# Patient Record
Sex: Female | Born: 1980 | Race: Black or African American | Hispanic: No | Marital: Single | Smoking: Never smoker
Health system: Southern US, Community
[De-identification: ages and names within clinical notes are randomized; demographics above are authoritative.]

## PROBLEM LIST (undated history)

## (undated) DIAGNOSIS — I4891 Unspecified atrial fibrillation: Secondary | ICD-10-CM

## (undated) DIAGNOSIS — Z91410 Personal history of adult physical and sexual abuse: Secondary | ICD-10-CM

## (undated) DIAGNOSIS — I10 Essential (primary) hypertension: Secondary | ICD-10-CM

## (undated) DIAGNOSIS — G43909 Migraine, unspecified, not intractable, without status migrainosus: Secondary | ICD-10-CM

## (undated) HISTORY — PX: DILATION AND CURETTAGE OF UTERUS: SHX78

## (undated) HISTORY — PX: LAPAROSCOPY: SHX197

## (undated) HISTORY — DX: Essential (primary) hypertension: I10

## (undated) HISTORY — PX: FOOT SURGERY: SHX648

## (undated) HISTORY — DX: Personal history of adult physical and sexual abuse: Z91.410

## (undated) HISTORY — DX: Migraine, unspecified, not intractable, without status migrainosus: G43.909

## (undated) HISTORY — DX: Unspecified atrial fibrillation: I48.91

## (undated) SURGERY — DILATION AND CURETTAGE
Anesthesia: General

---
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
## 2004-02-22 ENCOUNTER — Emergency Department: Payer: Self-pay | Admitting: Emergency Medicine

## 2004-05-11 ENCOUNTER — Emergency Department: Payer: Self-pay | Admitting: Emergency Medicine

## 2004-05-23 HISTORY — PX: DIAGNOSTIC LAPAROSCOPY: SUR761

## 2004-06-05 ENCOUNTER — Emergency Department: Payer: Self-pay | Admitting: Emergency Medicine

## 2004-07-16 ENCOUNTER — Emergency Department: Payer: Self-pay | Admitting: Emergency Medicine

## 2004-07-17 ENCOUNTER — Emergency Department: Payer: Self-pay | Admitting: Emergency Medicine

## 2004-08-22 ENCOUNTER — Emergency Department: Payer: Self-pay | Admitting: Internal Medicine

## 2005-01-23 ENCOUNTER — Emergency Department: Payer: Self-pay | Admitting: Internal Medicine

## 2005-02-10 ENCOUNTER — Ambulatory Visit: Payer: Self-pay | Admitting: Unknown Physician Specialty

## 2005-04-25 ENCOUNTER — Emergency Department: Payer: Self-pay | Admitting: Emergency Medicine

## 2005-11-10 ENCOUNTER — Emergency Department: Payer: Self-pay | Admitting: General Practice

## 2005-11-25 ENCOUNTER — Emergency Department: Payer: Self-pay | Admitting: Emergency Medicine

## 2006-02-18 ENCOUNTER — Emergency Department: Payer: Self-pay | Admitting: Unknown Physician Specialty

## 2006-06-05 ENCOUNTER — Other Ambulatory Visit: Payer: Self-pay

## 2006-06-05 ENCOUNTER — Emergency Department: Payer: Self-pay | Admitting: Emergency Medicine

## 2007-02-03 ENCOUNTER — Emergency Department: Payer: Self-pay | Admitting: Unknown Physician Specialty

## 2007-05-24 HISTORY — PX: CARDIAC CATHETERIZATION: SHX172

## 2007-06-28 ENCOUNTER — Encounter: Payer: Self-pay | Admitting: Maternal & Fetal Medicine

## 2007-07-14 ENCOUNTER — Emergency Department: Payer: Self-pay | Admitting: Emergency Medicine

## 2007-08-02 ENCOUNTER — Encounter: Payer: Self-pay | Admitting: Maternal & Fetal Medicine

## 2007-09-20 ENCOUNTER — Observation Stay: Payer: Self-pay | Admitting: Unknown Physician Specialty

## 2007-09-27 ENCOUNTER — Encounter: Payer: Self-pay | Admitting: Maternal & Fetal Medicine

## 2007-11-11 ENCOUNTER — Observation Stay: Payer: Self-pay | Admitting: Obstetrics and Gynecology

## 2007-11-23 ENCOUNTER — Observation Stay: Payer: Self-pay

## 2007-12-03 ENCOUNTER — Observation Stay: Payer: Self-pay | Admitting: Obstetrics and Gynecology

## 2007-12-06 ENCOUNTER — Observation Stay: Payer: Self-pay | Admitting: Unknown Physician Specialty

## 2007-12-07 ENCOUNTER — Observation Stay: Payer: Self-pay

## 2007-12-10 ENCOUNTER — Inpatient Hospital Stay: Payer: Self-pay

## 2008-06-17 ENCOUNTER — Ambulatory Visit: Payer: Self-pay | Admitting: General Surgery

## 2008-10-01 ENCOUNTER — Inpatient Hospital Stay: Payer: Self-pay | Admitting: Internal Medicine

## 2009-05-29 ENCOUNTER — Emergency Department: Payer: Self-pay | Admitting: Emergency Medicine

## 2009-12-24 ENCOUNTER — Emergency Department: Payer: Self-pay | Admitting: Emergency Medicine

## 2009-12-28 ENCOUNTER — Ambulatory Visit: Payer: Self-pay | Admitting: General Practice

## 2010-10-25 ENCOUNTER — Ambulatory Visit: Payer: Self-pay | Admitting: General Practice

## 2011-05-28 IMAGING — CR DG ANKLE COMPLETE 3+V*L*
1 series · 5 of 5 positions shown · non-contrast
Comparison: none

REASON FOR EXAM: pain fax result to [phone]
COMMENTS:

PROCEDURE:     DXR - DXR ANKLE LEFT COMPLETE  - December 28, 2009  [DATE]
RESULT:     Soft tissue swelling is identified within the medial and lateral
malleolar regions. There is no evidence of acute fracture or dislocation.

[Series 1: view not recorded · 0.17mm/px · 5 of 5 slices shown]
[im 1/5]
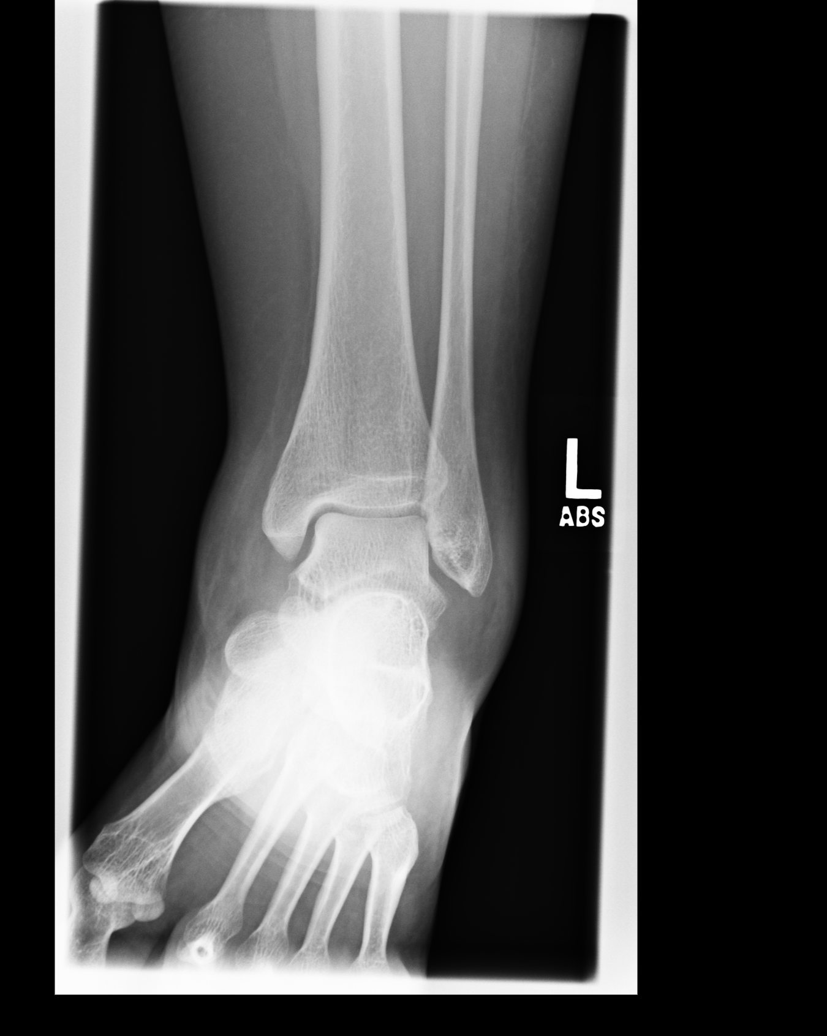
[im 2/5]
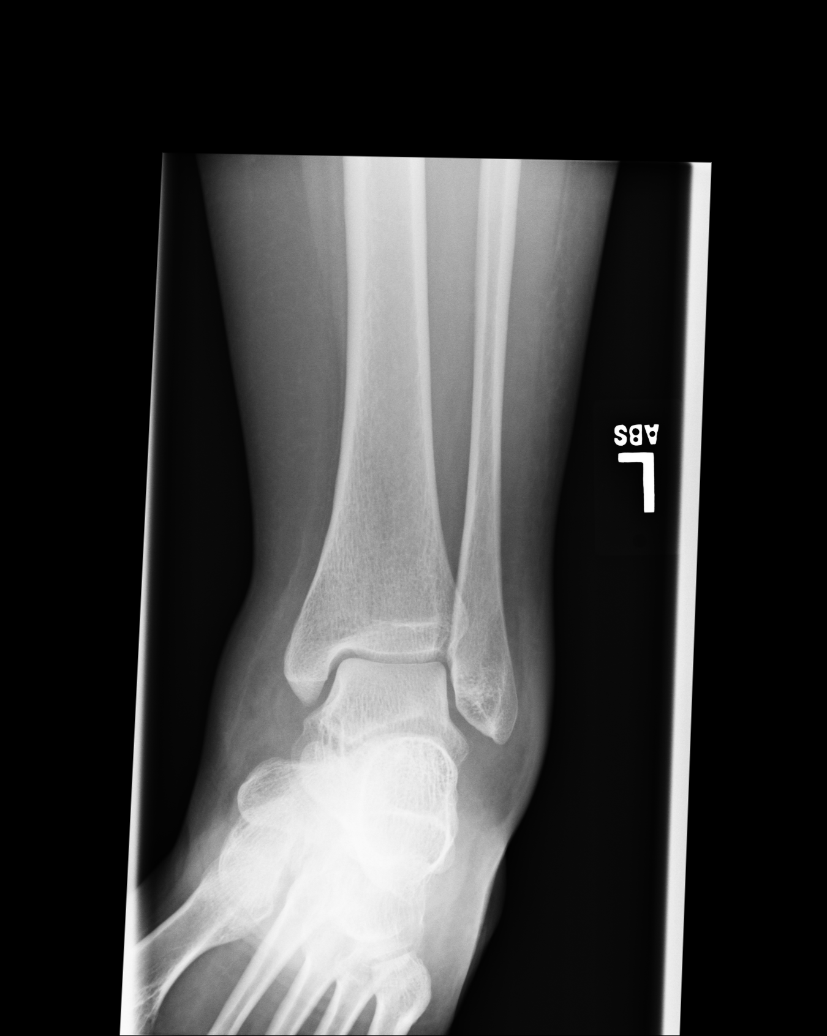
[im 3/5]
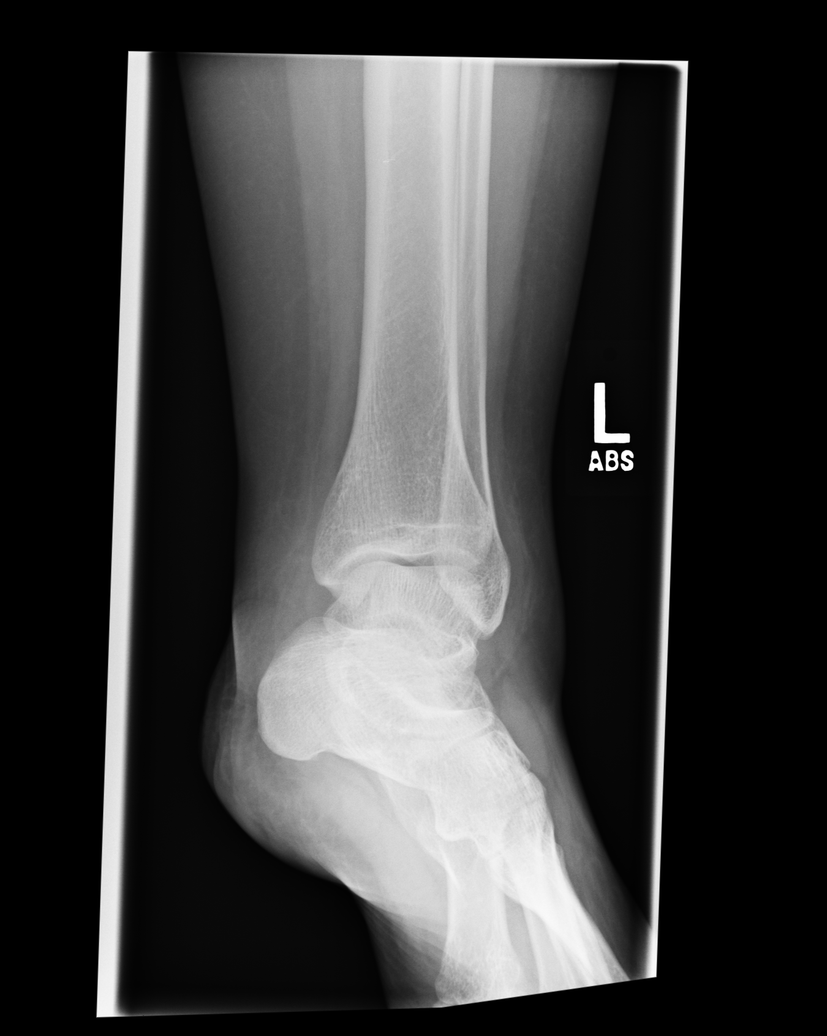
[im 4/5]
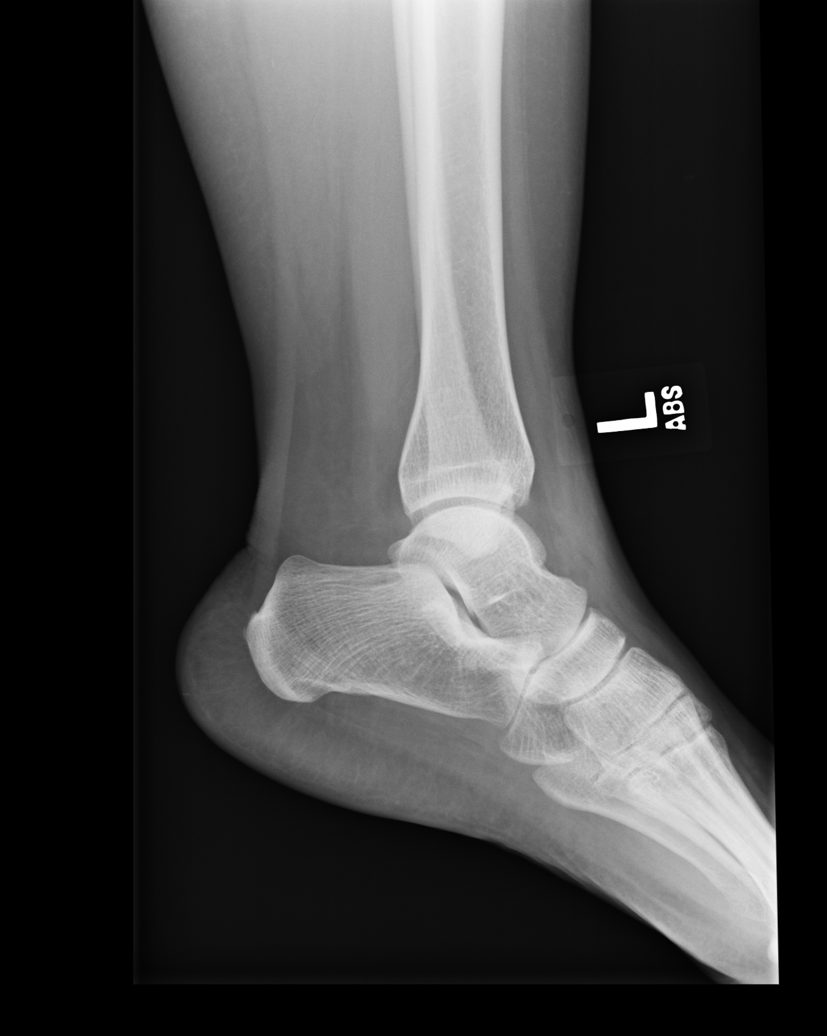
[im 5/5]
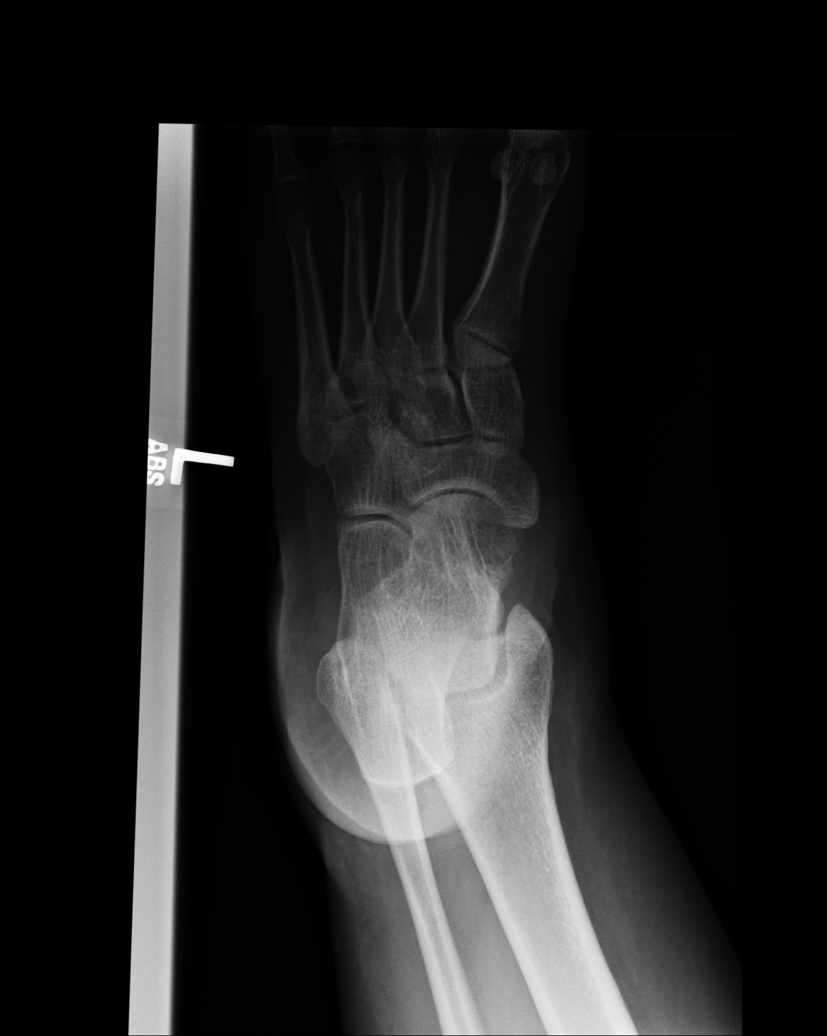

[5 of 5 positions shown; findings below may reference images not displayed]

IMPRESSION: 1. No evidence of acute osseous abnormalities.

2. Findings which may represent a component of ligamentous injury.
Surveillance evaluation is recommended if and as clinically warranted.

## 2011-10-28 ENCOUNTER — Ambulatory Visit: Payer: Self-pay

## 2011-10-28 LAB — HEMATOCRIT: HCT: 40.2 % (ref 35.0–47.0)

## 2011-12-07 ENCOUNTER — Ambulatory Visit: Payer: Self-pay | Admitting: General Practice

## 2012-02-16 ENCOUNTER — Emergency Department: Payer: Self-pay | Admitting: Emergency Medicine

## 2012-02-16 LAB — URINALYSIS, COMPLETE
Bilirubin,UR: NEGATIVE
Blood: NEGATIVE
Ketone: NEGATIVE
Ph: 5 (ref 4.5–8.0)
Protein: NEGATIVE
RBC,UR: 3 /HPF (ref 0–5)
Specific Gravity: 1.033 (ref 1.003–1.030)
Squamous Epithelial: 35

## 2012-02-16 LAB — LIPASE, BLOOD: Lipase: 181 U/L (ref 73–393)

## 2012-02-16 LAB — COMPREHENSIVE METABOLIC PANEL
Alkaline Phosphatase: 86 U/L (ref 50–136)
Anion Gap: 6 — ABNORMAL LOW (ref 7–16)
Bilirubin,Total: 0.6 mg/dL (ref 0.2–1.0)
Calcium, Total: 8.8 mg/dL (ref 8.5–10.1)
Chloride: 108 mmol/L — ABNORMAL HIGH (ref 98–107)
Co2: 26 mmol/L (ref 21–32)
Creatinine: 0.77 mg/dL (ref 0.60–1.30)
EGFR (African American): >60
EGFR (Non-African Amer.): >60
Osmolality: 276 (ref 275–301)
SGPT (ALT): 19 U/L (ref 12–78)
Sodium: 140 mmol/L (ref 136–145)
Total Protein: 7.2 g/dL (ref 6.4–8.2)

## 2012-02-16 LAB — CBC
MCH: 31.1 pg (ref 26.0–34.0)
MCHC: 35 g/dL (ref 32.0–36.0)
MCV: 89 fL (ref 80–100)
Platelet: 269 10*3/uL (ref 150–440)
RDW: 13.1 % (ref 11.5–14.5)
WBC: 4 10*3/uL (ref 3.6–11.0)

## 2012-02-16 LAB — PREGNANCY, URINE: Pregnancy Test, Urine: NEGATIVE m[IU]/mL

## 2012-07-25 ENCOUNTER — Emergency Department: Payer: Self-pay | Admitting: Emergency Medicine

## 2013-03-27 ENCOUNTER — Emergency Department: Payer: Self-pay | Admitting: Emergency Medicine

## 2013-03-27 LAB — CBC WITH DIFFERENTIAL/PLATELET
Basophil #: 0 10*3/uL (ref 0.0–0.1)
Basophil %: 0.7 %
Eosinophil #: 0.1 10*3/uL (ref 0.0–0.7)
Eosinophil %: 2.3 %
HCT: 38.2 % (ref 35.0–47.0)
Lymphocyte #: 1.6 10*3/uL (ref 1.0–3.6)
MCH: 31.3 pg (ref 26.0–34.0)
MCHC: 34.6 g/dL (ref 32.0–36.0)
MCV: 90 fL (ref 80–100)
Monocyte %: 6.1 %
Neutrophil %: 61.2 %
Platelet: 238 10*3/uL (ref 150–440)
RDW: 12.5 % (ref 11.5–14.5)

## 2013-03-27 LAB — BASIC METABOLIC PANEL
Calcium, Total: 8.7 mg/dL (ref 8.5–10.1)
Creatinine: 0.74 mg/dL (ref 0.60–1.30)
EGFR (Non-African Amer.): >60
Glucose: 104 mg/dL — ABNORMAL HIGH (ref 65–99)
Potassium: 3.7 mmol/L (ref 3.5–5.1)
Sodium: 142 mmol/L (ref 136–145)

## 2013-03-27 LAB — TROPONIN I: Troponin-I: <0.02 ng/mL

## 2013-03-28 ENCOUNTER — Encounter: Payer: Self-pay | Admitting: Cardiovascular Disease

## 2013-03-28 ENCOUNTER — Encounter: Payer: Self-pay | Admitting: *Deleted

## 2013-03-28 ENCOUNTER — Encounter (INDEPENDENT_AMBULATORY_CARE_PROVIDER_SITE_OTHER): Payer: Self-pay

## 2013-03-28 ENCOUNTER — Ambulatory Visit (INDEPENDENT_AMBULATORY_CARE_PROVIDER_SITE_OTHER): Payer: PRIVATE HEALTH INSURANCE | Admitting: Cardiovascular Disease

## 2013-03-28 VITALS — BP 134/90 | HR 79 | Ht 67.5 in | Wt 195.8 lb

## 2013-03-28 VITALS — BP 121/86 | HR 74 | Ht 67.5 in | Wt 195.8 lb

## 2013-03-28 DIAGNOSIS — R000 Tachycardia, unspecified: Secondary | ICD-10-CM

## 2013-03-28 DIAGNOSIS — R079 Chest pain, unspecified: Secondary | ICD-10-CM

## 2013-03-28 NOTE — Patient Instructions (Addendum)
Your stress test is normal.   Your physician has requested that you have an echocardiogram. Echocardiography is a painless test that uses sound waves to create images of your heart. It provides your doctor with information about the size and shape of your heart and how well your heart's chambers and valves are working. This procedure takes approximately one hour. There are no restrictions for this procedure.  Follow up as needed.  

## 2013-03-28 NOTE — Procedures (Signed)
   Treadmill Stress test  Indication: Exertional chest pain.  Baseline Data:  Resting EKG shows NSR with rate of 78 bpm, no significant ST or T wave changes Resting blood pressure of 134/90 mm Hg Stand bruce protocal was used.  Exercise Data:  Patient exercised for 9 min 0 sec,  Peak heart rate of 164 bpm.  This was 86 % of the maximum predicted heart rate. She complained of nonlimiting chest tightness. Peak Blood pressure recorded was 178/88 Maximal work level: 10.1 METs.  Heart rate at 3 minutes in recovery was 93 bpm. BP response: Normal HR response: Normal  EKG with Exercise: Sinus tachycardia with no significant ST changes  FINAL IMPRESSION: Normal exercise stress test. No significant EKG changes concerning for ischemia. Good exercise tolerance. Exercise induced chest tightness with no EKG changes.  Recommendation: Recommend an echocardiogram.

## 2013-03-28 NOTE — Patient Instructions (Signed)
Your physician has requested that you have an exercise tolerance test. For further information please visit [email or web address]. Please also follow instruction sheet, as given.   

## 2013-03-28 NOTE — Assessment & Plan Note (Addendum)
Chest pain is overall atypical but I am concerned about the exertional component. Physical exam is unremarkable. Recent basic workup was negative in the emergency room. Baseline EKG does not show any ischemic changes. Some of her symptoms might be triggered by stress and anxiety. I proceeded with a treadmill stress test which showed no evidence of ischemia. However, she had substernal chest tightness with exercise. Due to that, I will request an echocardiogram to ensure no structural heart abnormalities. If the echocardiogram is unremarkable, No further cardiac workup is recommended.  Addendum: 03/31/2013. i reviewed previous records.  Cardiac catheterization in 2010 showed normal coronary arteries with normal right-sided filling pressures and cardiac output. Also cardiac MRI done in 2010 for suspicions of infiltrative cardiomyopathy showed normal LV systolic function with an ejection fraction of 65%, mild mitral regurgitation and no evidence of TR VD or infiltrative cardiomyopathy.

## 2013-03-28 NOTE — Progress Notes (Signed)
HPI  This is a 32 year old African American female who was referred from the emergency room at [facility] for evaluation of chest pain. The patient had a previous episode of atrial fibrillation in 2010 and was hospitalized briefly at [facility]. She converted to sinus rhythm overnight. No recurrent arrhythmia since then. She informed me that she underwent a Holter monitor monitor subsequently with Dr. Juliann Pares with no significant arrhythmia noted. On Monday, she felt very tired with a headache which was different from her usual migraine. Next morning she noticed substernal chest tightness which lasted all day. She initially went to urgent care. She was directed to the emergency room. Troponin was negative. D-dimer was normal. Labs were unremarkable. EKG showed no acute changes. Chest x-ray showed no significant abnormalities. She was discharged home and was scheduled to see [place] for followup. She chest pain free. She describes the pain as tightness feeling both at rest and with physical activities. It is associated with tingling sensation in her lips with some anxiety. She has been under stress lately. She does not smoke cigarettes but admits to using marijuana. There is no family history of premature coronary artery disease.  Allergies  Allergen Reactions  . Ivp Dye [Iodinated Diagnostic Agents]   . Shellfish Allergy      No current outpatient prescriptions on file prior to visit.   No current facility-administered medications on file prior to visit.     Past Medical History  Diagnosis Date  . Hypertension   . Migraines   . A-fib      Past Surgical History  Procedure Laterality Date  . Cardiac catheterization  2009    [facility]  . Foot surgery Bilateral   . Laparoscopy       Family History  Problem Relation Age of Onset  . Hypertension Mother   . Diabetes Mother   . Hypertension Father      History   Social History  . Marital Status: Single    Spouse Name: N/A    Number of Children:  N/A  . Years of Education: N/A   Occupational History  . Not on file.   Social History Main Topics  . Smoking status: Never Smoker   . Smokeless tobacco: Not on file  . Alcohol Use: Yes     Comment: socially  . Drug Use: No  . Sexual Activity: Not on file   Other Topics Concern  . Not on file   Social History Narrative  . No narrative on file     ROS A 10 point review of system was performed. It is negative other than that mentioned in the history of present illness.   PHYSICAL EXAM   BP 121/86  Pulse 74  Ht 5' 7.5" (1.715 m)  Wt 195 lb 12 oz (88.792 kg)  BMI 30.19 kg/m2 Constitutional: She is oriented to person, place, and time. She appears well-developed and well-nourished. No distress.  HENT: No nasal discharge.  Head: Normocephalic and atraumatic.  Eyes: Pupils are equal and round. No discharge.  Neck: Normal range of motion. Neck supple. No JVD present. No thyromegaly present.  Cardiovascular: Normal rate, regular rhythm, normal heart sounds. Exam reveals no gallop and no friction rub. No murmur heard.  Pulmonary/Chest: Effort normal and breath sounds normal. No stridor. No respiratory distress. She has no wheezes. She has no rales. She exhibits no tenderness.  Abdominal: Soft. Bowel sounds are normal. She exhibits no distension. There is no tenderness. There is no rebound and no guarding.  Musculoskeletal: Normal range of motion. She exhibits no edema and no tenderness.  Neurological: She is alert and oriented to person, place, and time. Coordination normal.  Skin: Skin is warm and dry. No rash noted. She is not diaphoretic. No erythema. No pallor.  Psychiatric: She has a normal mood and affect. Her behavior is normal. Judgment and thought content normal.     ZOX:WRUEA  Rhythm  WITHIN NORMAL LIMITS   ASSESSMENT AND PLAN

## 2013-04-15 ENCOUNTER — Other Ambulatory Visit (INDEPENDENT_AMBULATORY_CARE_PROVIDER_SITE_OTHER): Payer: PRIVATE HEALTH INSURANCE

## 2013-04-15 ENCOUNTER — Other Ambulatory Visit: Payer: Self-pay

## 2013-04-15 DIAGNOSIS — I059 Rheumatic mitral valve disease, unspecified: Secondary | ICD-10-CM

## 2013-04-15 DIAGNOSIS — R079 Chest pain, unspecified: Secondary | ICD-10-CM

## 2013-04-22 ENCOUNTER — Encounter: Payer: Self-pay | Admitting: *Deleted

## 2013-05-23 HISTORY — PX: DIAGNOSTIC LAPAROSCOPY: SUR761

## 2013-05-31 ENCOUNTER — Emergency Department: Payer: Self-pay | Admitting: Emergency Medicine

## 2013-05-31 LAB — COMPREHENSIVE METABOLIC PANEL
ALK PHOS: 72 U/L
AST: 18 U/L (ref 15–37)
Albumin: 3.5 g/dL (ref 3.4–5.0)
Anion Gap: 7 (ref 7–16)
BUN: 5 mg/dL — ABNORMAL LOW (ref 7–18)
Bilirubin,Total: 1.2 mg/dL — ABNORMAL HIGH (ref 0.2–1.0)
CHLORIDE: 104 mmol/L (ref 98–107)
CO2: 24 mmol/L (ref 21–32)
Calcium, Total: 8.9 mg/dL (ref 8.5–10.1)
Creatinine: 0.71 mg/dL (ref 0.60–1.30)
EGFR (African American): >60
EGFR (Non-African Amer.): >60
GLUCOSE: 93 mg/dL (ref 65–99)
OSMOLALITY: 267 (ref 275–301)
Potassium: 3.5 mmol/L (ref 3.5–5.1)
SGPT (ALT): 17 U/L (ref 12–78)
Sodium: 135 mmol/L — ABNORMAL LOW (ref 136–145)
TOTAL PROTEIN: 7.2 g/dL (ref 6.4–8.2)

## 2013-05-31 LAB — CBC
HCT: 39.5 % (ref 35.0–47.0)
HGB: 13.4 g/dL (ref 12.0–16.0)
MCH: 30.9 pg (ref 26.0–34.0)
MCHC: 33.8 g/dL (ref 32.0–36.0)
MCV: 92 fL (ref 80–100)
Platelet: 236 10*3/uL (ref 150–440)
RBC: 4.32 10*6/uL (ref 3.80–5.20)
RDW: 12.9 % (ref 11.5–14.5)
WBC: 5.8 10*3/uL (ref 3.6–11.0)

## 2013-05-31 LAB — TROPONIN I: Troponin-I: <0.02 ng/mL

## 2013-10-02 ENCOUNTER — Ambulatory Visit: Payer: Self-pay | Admitting: Obstetrics and Gynecology

## 2013-10-02 LAB — CBC
HCT: 39.2 % (ref 35.0–47.0)
HGB: 13.3 g/dL (ref 12.0–16.0)
MCH: 31.7 pg (ref 26.0–34.0)
MCHC: 33.8 g/dL (ref 32.0–36.0)
MCV: 94 fL (ref 80–100)
Platelet: 220 10*3/uL (ref 150–440)
RBC: 4.19 10*6/uL (ref 3.80–5.20)
RDW: 13.4 % (ref 11.5–14.5)
WBC: 4.6 10*3/uL (ref 3.6–11.0)

## 2013-10-02 LAB — COMPREHENSIVE METABOLIC PANEL
ALK PHOS: 58 U/L
Albumin: 3.5 g/dL (ref 3.4–5.0)
Anion Gap: 4 — ABNORMAL LOW (ref 7–16)
BUN: 6 mg/dL — ABNORMAL LOW (ref 7–18)
Bilirubin,Total: 0.8 mg/dL (ref 0.2–1.0)
CALCIUM: 8.8 mg/dL (ref 8.5–10.1)
Chloride: 107 mmol/L (ref 98–107)
Co2: 28 mmol/L (ref 21–32)
Creatinine: 0.72 mg/dL (ref 0.60–1.30)
EGFR (Non-African Amer.): >60
Glucose: 81 mg/dL (ref 65–99)
Osmolality: 274 (ref 275–301)
Potassium: 3.6 mmol/L (ref 3.5–5.1)
SGOT(AST): 15 U/L (ref 15–37)
SGPT (ALT): 15 U/L (ref 12–78)
Sodium: 139 mmol/L (ref 136–145)
Total Protein: 6.7 g/dL (ref 6.4–8.2)

## 2013-10-08 ENCOUNTER — Ambulatory Visit: Payer: Self-pay | Admitting: Obstetrics and Gynecology

## 2013-12-08 ENCOUNTER — Emergency Department: Payer: Self-pay | Admitting: Internal Medicine

## 2013-12-08 LAB — CBC
HCT: 42.3 % (ref 35.0–47.0)
HGB: 13.9 g/dL (ref 12.0–16.0)
MCH: 31.5 pg (ref 26.0–34.0)
MCHC: 32.8 g/dL (ref 32.0–36.0)
MCV: 96 fL (ref 80–100)
PLATELETS: 231 10*3/uL (ref 150–440)
RBC: 4.41 10*6/uL (ref 3.80–5.20)
RDW: 13 % (ref 11.5–14.5)
WBC: 3.9 10*3/uL (ref 3.6–11.0)

## 2013-12-08 LAB — BASIC METABOLIC PANEL
ANION GAP: 5 — AB (ref 7–16)
BUN: 4 mg/dL — ABNORMAL LOW (ref 7–18)
CALCIUM: 8.6 mg/dL (ref 8.5–10.1)
CREATININE: 0.69 mg/dL (ref 0.60–1.30)
Chloride: 109 mmol/L — ABNORMAL HIGH (ref 98–107)
Co2: 28 mmol/L (ref 21–32)
EGFR (Non-African Amer.): >60
Glucose: 110 mg/dL — ABNORMAL HIGH (ref 65–99)
OSMOLALITY: 281 (ref 275–301)
Potassium: 3.8 mmol/L (ref 3.5–5.1)
SODIUM: 142 mmol/L (ref 136–145)

## 2013-12-08 LAB — URINALYSIS, COMPLETE
Bilirubin,UR: NEGATIVE
GLUCOSE, UR: NEGATIVE mg/dL (ref 0–75)
KETONE: NEGATIVE
LEUKOCYTE ESTERASE: NEGATIVE
Nitrite: NEGATIVE
Ph: 6 (ref 4.5–8.0)
Protein: NEGATIVE
SPECIFIC GRAVITY: 1.014 (ref 1.003–1.030)
Squamous Epithelial: 1
WBC UR: 2 /HPF (ref 0–5)

## 2014-03-16 ENCOUNTER — Emergency Department: Payer: Self-pay | Admitting: Internal Medicine

## 2014-06-10 ENCOUNTER — Emergency Department: Payer: Self-pay | Admitting: Emergency Medicine

## 2014-06-11 LAB — COMPREHENSIVE METABOLIC PANEL
ALBUMIN: 3.7 g/dL (ref 3.4–5.0)
ALK PHOS: 69 U/L
ALT: 16 U/L
ANION GAP: 4 — AB (ref 7–16)
BUN: 7 mg/dL (ref 7–18)
Bilirubin,Total: 0.7 mg/dL (ref 0.2–1.0)
CALCIUM: 9.1 mg/dL (ref 8.5–10.1)
CHLORIDE: 107 mmol/L (ref 98–107)
CO2: 30 mmol/L (ref 21–32)
Creatinine: 0.67 mg/dL (ref 0.60–1.30)
EGFR (African American): >60
EGFR (Non-African Amer.): >60
Glucose: 71 mg/dL (ref 65–99)
Osmolality: 278 (ref 275–301)
Potassium: 3.8 mmol/L (ref 3.5–5.1)
SGOT(AST): 17 U/L (ref 15–37)
SODIUM: 141 mmol/L (ref 136–145)
Total Protein: 7.2 g/dL (ref 6.4–8.2)

## 2014-06-11 LAB — CBC WITH DIFFERENTIAL/PLATELET
BASOS ABS: 0 10*3/uL (ref 0.0–0.1)
Basophil %: 0.6 %
Eosinophil #: 0.2 10*3/uL (ref 0.0–0.7)
Eosinophil %: 2.6 %
HCT: 40.8 % (ref 35.0–47.0)
HGB: 13.3 g/dL (ref 12.0–16.0)
LYMPHS ABS: 1.7 10*3/uL (ref 1.0–3.6)
Lymphocyte %: 28.2 %
MCH: 31.1 pg (ref 26.0–34.0)
MCHC: 32.6 g/dL (ref 32.0–36.0)
MCV: 95 fL (ref 80–100)
MONO ABS: 0.5 x10 3/mm (ref 0.2–0.9)
Monocyte %: 8.7 %
Neutrophil #: 3.7 10*3/uL (ref 1.4–6.5)
Neutrophil %: 59.9 %
PLATELETS: 245 10*3/uL (ref 150–440)
RBC: 4.27 10*6/uL (ref 3.80–5.20)
RDW: 12.9 % (ref 11.5–14.5)
WBC: 6.1 10*3/uL (ref 3.6–11.0)

## 2014-06-11 LAB — HCG, QUANTITATIVE, PREGNANCY: BETA HCG, QUANT.: 1 m[IU]/mL

## 2014-07-18 ENCOUNTER — Emergency Department: Payer: Self-pay | Admitting: Emergency Medicine

## 2014-07-21 ENCOUNTER — Emergency Department: Payer: Self-pay | Admitting: Emergency Medicine

## 2014-08-25 ENCOUNTER — Emergency Department: Admit: 2014-08-25 | Disposition: A | Discharge status: Home / Self Care | Payer: Self-pay | Admitting: Student

## 2014-08-25 LAB — CBC WITH DIFFERENTIAL/PLATELET
BASOS PCT: 0.2 %
Basophil #: 0 10*3/uL (ref 0.0–0.1)
EOS PCT: 0.4 %
Eosinophil #: 0 10*3/uL (ref 0.0–0.7)
HCT: 39.6 % (ref 35.0–47.0)
HGB: 13.2 g/dL (ref 12.0–16.0)
LYMPHS PCT: 15.6 %
Lymphocyte #: 1 10*3/uL (ref 1.0–3.6)
MCH: 30.8 pg (ref 26.0–34.0)
MCHC: 33.4 g/dL (ref 32.0–36.0)
MCV: 92 fL (ref 80–100)
Monocyte #: 0.4 x10 3/mm (ref 0.2–0.9)
Monocyte %: 5.6 %
NEUTROS PCT: 78.2 %
Neutrophil #: 5.1 10*3/uL (ref 1.4–6.5)
Platelet: 240 10*3/uL (ref 150–440)
RBC: 4.29 10*6/uL (ref 3.80–5.20)
RDW: 13.2 % (ref 11.5–14.5)
WBC: 6.5 10*3/uL (ref 3.6–11.0)

## 2014-08-25 LAB — URINALYSIS, COMPLETE
Bilirubin,UR: NEGATIVE
Blood: NEGATIVE
Glucose,UR: NEGATIVE mg/dL (ref 0–75)
Leukocyte Esterase: NEGATIVE
Nitrite: NEGATIVE
Ph: 5 (ref 4.5–8.0)
Protein: 30
RBC,UR: <1 /HPF (ref 0–5)
Specific Gravity: 1.028 (ref 1.003–1.030)
Squamous Epithelial: 2
WBC UR: 1 /HPF (ref 0–5)

## 2014-08-25 LAB — COMPREHENSIVE METABOLIC PANEL
ALK PHOS: 44 U/L
ALT: 11 U/L — AB
ANION GAP: 7 (ref 7–16)
Albumin: 4 g/dL
BUN: 6 mg/dL
Bilirubin,Total: 1 mg/dL
CALCIUM: 9.3 mg/dL
CO2: 23 mmol/L
Chloride: 105 mmol/L
Creatinine: 0.6 mg/dL
EGFR (African American): >60
Glucose: 94 mg/dL
POTASSIUM: 3.5 mmol/L
SGOT(AST): 15 U/L
Sodium: 135 mmol/L
Total Protein: 7.3 g/dL

## 2014-08-25 LAB — LIPASE, BLOOD: Lipase: 52 U/L — ABNORMAL HIGH

## 2014-08-25 LAB — HCG, QUANTITATIVE, PREGNANCY: BETA HCG, QUANT.: 94984 m[IU]/mL — AB

## 2014-08-27 ENCOUNTER — Emergency Department
Admit: 2014-08-27 | Disposition: A | Discharge status: Home / Self Care | Payer: Self-pay | Admitting: Emergency Medicine

## 2014-08-27 LAB — COMPREHENSIVE METABOLIC PANEL
AST: 14 U/L — AB
Albumin: 3.8 g/dL
Alkaline Phosphatase: 45 U/L
Anion Gap: 7 (ref 7–16)
BUN: 6 mg/dL
Bilirubin,Total: 1 mg/dL
CO2: 23 mmol/L
Calcium, Total: 9 mg/dL
Chloride: 107 mmol/L
Creatinine: 0.54 mg/dL
EGFR (African American): >60
GLUCOSE: 94 mg/dL
Potassium: 3.3 mmol/L — ABNORMAL LOW
SGPT (ALT): 10 U/L — ABNORMAL LOW
SODIUM: 137 mmol/L
Total Protein: 6.8 g/dL

## 2014-08-27 LAB — URINALYSIS, COMPLETE
Bilirubin,UR: NEGATIVE
Blood: NEGATIVE
Glucose,UR: NEGATIVE mg/dL (ref 0–75)
LEUKOCYTE ESTERASE: NEGATIVE
Nitrite: NEGATIVE
PH: 7 (ref 4.5–8.0)
Protein: NEGATIVE
RBC,UR: NONE SEEN /HPF (ref 0–5)
Specific Gravity: 1.017 (ref 1.003–1.030)
Squamous Epithelial: 4
WBC UR: <1 /HPF (ref 0–5)

## 2014-08-27 LAB — CBC
HCT: 38.9 % (ref 35.0–47.0)
HGB: 12.7 g/dL (ref 12.0–16.0)
MCH: 30.6 pg (ref 26.0–34.0)
MCHC: 32.7 g/dL (ref 32.0–36.0)
MCV: 94 fL (ref 80–100)
Platelet: 216 10*3/uL (ref 150–440)
RBC: 4.16 10*6/uL (ref 3.80–5.20)
RDW: 13.4 % (ref 11.5–14.5)
WBC: 4.8 10*3/uL (ref 3.6–11.0)

## 2014-08-27 LAB — LIPASE, BLOOD: Lipase: 53 U/L — ABNORMAL HIGH

## 2014-08-27 LAB — HCG, QUANTITATIVE, PREGNANCY: Beta Hcg, Quant.: 112636 m[IU]/mL — ABNORMAL HIGH

## 2014-09-13 NOTE — Op Note (Signed)
PATIENT NAME:  Joyce Weber, Joyce Weber MR#:  [id number] DATE OF BIRTH:  07-07-1980  DATE OF PROCEDURE:  10/08/2013  PREOPERATIVE DIAGNOSES: 1.  Chronic pelvic pain.  2.  Endometriosis.   POSTOPERATIVE DIAGNOSES: 1.  Chronic pelvic pain.  2.  Endometriosis.   PROCEDURE:   1.  Diagnostic laparoscopy.  2.  Fulguration of endometriotic implants.   ANESTHESIA:  General.   SURGEON: Thomasene Mohair, M.D.   ESTIMATED BLOOD LOSS: 10 mL.   OPERATIVE FLUIDS: 1000 mL crystalloid.   COMPLICATIONS: None.   FINDINGS: 1.  Normal-appearing uterus, fallopian tubes and ovaries.  2.  Endometriosis implants along bilateral uterosacral ligaments, in the bilateral ovarian fossae and in the posterior cul-de-sac.  3.  Enlarged left hepatic lobe.    SPECIMENS: None.   CONDITION AT THE END OF PROCEDURE: Stable.   PROCEDURE IN DETAIL: The patient was taken to the Operating Room where general anesthesia was administered and found to be adequate. She was placed in the dorsal supine lithotomy position in [place] stirrups and prepped and draped in the usual sterile fashion. After a timeout was called an indwelling catheter was placed in her bladder and a sterile speculum  was placed in the vagina. A single-tooth tenaculum was used to grasp the anterior lip of the cervix. An acorn uterine manipulator was affixed to the tenaculum.   Attention was turned to the abdomen where, after injection of local anesthetic, a 5 mm infraumbilical incision was made with a scalpel and entrance to the abdomen was gained using direct camera visualization with the Optiview trocar method. Entrance into the abdomen was verified using opening pressures. After insufflation with CO2, the camera was reintroduced through the port to verify atraumatic entry. A 5 mm left lower quadrant port was placed under direct intra-abdominal camera visualization without difficulty. Another 5 mm port was placed approximately 2 cm in the midline in the suprapubic  area under direct intra-abdominal camera visualization. Survey of the abdomen and pelvis was undertaken with the above-noted findings. It was determined that removal of the implants would be risky given the amount of scar tissue and deformity in the underlying tissue around the endometriosis implants. The ureters were located, verified to be away from the implants. At this point, the fulguration using monopolar electrocautery was undertaken in every reachable area. Fairly shallow obliteration was undertaken given that underlying structures could not be viewed with great ease. After fulguration of each of these implants, hemostasis was noted and irrigation of the pelvis was undertaken. The appendix was also inspected and found to be apparently normal.   This terminated the procedure given she already had a diagnosis of endometriosis. The abdomen was desufflated of CO2 and all port sites were removed without difficulty. Each incision was closed subcutaneously using 3-0 Vicryl to reapproximate the skin. For closure of the skin, Dermabond was applied and after Dermabond was applied a total of approximately 20 mL of 0.5% Sensorcaine plain was used in aggregate at all 3 incision sites.   The uterine manipulator and tenaculum were removed from the cervix and hemostasis was noted. Foley catheter was removed from the bladder. The vagina was verified to be free of any instrumentation or sponges.   The patient tolerated the procedure well. Sponge, lap and needle counts were correct x 2. For VTE prophylaxis, she was wearing pneumatic compression stockings throughout the procedure. She was awakened in the Operating Room and taken to the recovery area in stable condition.    ____________________________ Conard Novak, MD sdj:cs  D: 10/08/2013 16:48:29 ET T: 10/08/2013 18:04:39 ET JOB#: [id number]  cc: Conard NovakStephen D. Infiniti Hoefling, MD, <Dictator> Conard NovakSTEPHEN D Tino Ronan MD ELECTRONICALLY SIGNED 11/12/2013 21:45

## 2014-09-14 NOTE — Op Note (Signed)
PATIENT NAME:  Joyce Weber, Sapir MR#:  [id number] DATE OF BIRTH:  [birth year]  DATE OF PROCEDURE:  10/28/2011  PREOPERATIVE DIAGNOSIS: Incomplete abortion.   POSTOPERATIVE DIAGNOSIS: Incomplete abortion.   PROCEDURE PERFORMED: Completion curettage.   SURGEON: Deloris Pinghilip J. Luella Cookosenow, MD   OPERATIVE FINDINGS: Small amount of tissue.   DESCRIPTION OF PROCEDURE: After adequate general anesthesia, the patient was prepped and draped in routine fashion. The cervix was already dilated. The uterine cavity was systematically suction curetted with a #10 suction curette with return of a small amount of tissue. Controlled curettage revealed a "clean" field. The patient tolerated the procedure well and left the Operating Room in good condition. Sponge and needle counts were said to be correct at the end of the procedure.  ____________________________ Deloris PingPhilip J. Luella Cookosenow, MD pjr:cbb D: 10/28/2011 10:25:59 ET T: 10/28/2011 12:11:44 ET JOB#: [id number]  cc: Deloris PingPhilip J. Luella Cookosenow, MD, <Dictator> Towana BadgerPHILIP J ROSENOW MD ELECTRONICALLY SIGNED 10/31/2011 5:20

## 2014-10-09 ENCOUNTER — Ambulatory Visit
Admission: RE | Admit: 2014-10-09 | Discharge: 2014-10-09 | Disposition: A | Discharge status: Home / Self Care | Payer: PRIVATE HEALTH INSURANCE | Source: Ambulatory Visit | Attending: Obstetrics and Gynecology | Admitting: Obstetrics and Gynecology

## 2014-10-09 VITALS — BP 127/80 | HR 86 | Ht 67.0 in | Wt 176.0 lb

## 2014-10-09 DIAGNOSIS — O10019 Pre-existing essential hypertension complicating pregnancy, unspecified trimester: Secondary | ICD-10-CM

## 2014-10-09 NOTE — Progress Notes (Signed)
[facility] Consultation   Chief Complaint: Consultation for history of HTN, Afib and ? TIA, currently pregnant  HPI: Ms. Joyce Weber is a [age] y.o. G5P2020 at 6634w2d by LMP=8wk ultrasound who [facility]  for management recommendations given her past medical history.  In 2008, Ms. Joyce Weber was evaluated for headaches with neurological symptoms including numbness and confusion that were ultimately diagnosed as complicated migraines.  She underwent MRI of the head, CT of the head and LP which were all negative.  She was tried on Zanaflex, amitriptyline and propranalol for a period of several months and eventually discontinued.  She currently has rare migraines and treats them with OTC meds only.  In 2010, she had recurrent episodes of chest pain and palpitations for which she was evaluated with EKG/Holter monitor, cardiac MRI, echocardiogram and heart catheterization which were all negative; her echo at that time demonstrated a normal EF (65%), mild MR.  Afib was never able to be documented [facility] (records obtained through Care Everywhere) although she states she [facility].  She was maintained on Diltiazem.  Since that time she has lost almost 100# over about 5 years (the first 30# were stress related due to a breakup of a relationship, the remainder were intentional).  She has been able to discontinue all of her medications.  She most recently was seen by cardiology 1 year ago and has no ongoing issues.  Past Medical History: Patient  has a past medical history of Hypertension; Migraines; and A-fib.  Past Surgical History: She  has past surgical history that includes Cardiac catheterization (2009); Foot surgery (Bilateral); laparoscopy; Dilation and curettage of uterus; Diagnostic laparoscopy (2006); and Diagnostic laparoscopy (2015).  Obstetric History:  OB History    Gravida Para Term  Preterm AB TAB SAB Ectopic Multiple Living   5 2 2  2 2    0        13w 2d Current                    12/11/2007 37w 0d Term  6 lbs 9 oz (2.977 kg) M                  06/12/2003 40w 0d Term Vag-Spont 6 lbs 9 oz (2.977 kg) F                     TAB                         TAB                         Gynecologic History:  Patient's last menstrual period was 07/08/2014.  Hx of abnormal pap smears: no  Medications: Diclegis Allergies: Patient is allergic to ivp dye and shellfish allergy.  Social History: Patient  reports that she has never smoked. She does not have any smokeless tobacco history on file. She reports that she does not drink alcohol or use illicit drugs.  Family History: family history includes Aneurysm in her maternal aunt and maternal grandmother; Breast cancer in her maternal grandmother; Diabetes in her mother and paternal grandfather; Hypertension in her father, mother, and paternal grandfather; Kidney disease in her paternal grandfather.  Review of Systems A full 12 point review of systems was negative or as noted in the History of  Present Illness.  Physical Exam: BP 127/80 mmHg  Pulse 86  Ht 5\' 7"  (1.702 m)  Wt 176 lb (79.833 kg)  BMI 27.56 kg/m2  LMP 07/08/2014  Asessement: 34yo [id number] at 10811w2d with a history of HTN, ? Afib and complicated migraines, currently without cardiac issues.    Plan: We discussed that given the lack of evidence that she has any sustained arrythmia or cardiac disease, and no diagnosis of TIA (complicated migraine only), she does not require anticoagulation.  I do agree with starting 81mg  of ASA after the first trimester as women with a history of migraines may be more susceptible to complications in pregnancy.  She also had HTN during her last pregnancy (although it was prior to her weight loss).  Consider baseline p/c ratio, CMP and EKG.  Would also recommend early glucola screening given strong family history of diabetes.  Total  time spent with the patient was 30 minutes with greater than 50% spent in counseling and coordination of care. We appreciate this interesting consult and will be happy to be involved in the ongoing care of Ms. Joyce Weber in anyway her obstetricians desire.  Kirby FunkSarah Pruitt Taboada, MD Maternal-Fetal Medicine [facility]

## 2014-10-29 IMAGING — CR DG CHEST 2V
1 series · 2 of 2 positions shown · non-contrast
Comparison: PA and lateral chest x-ray March 27, 2013

CLINICAL DATA: Cough and chest pain, 0 and history of flu
approximately 1 month ago. History of atrial fibrillation

EXAM:
CHEST  2 VIEW

[Series 1: pa · 0.17mm/px · 2 of 2 slices shown]
[im 1/2]
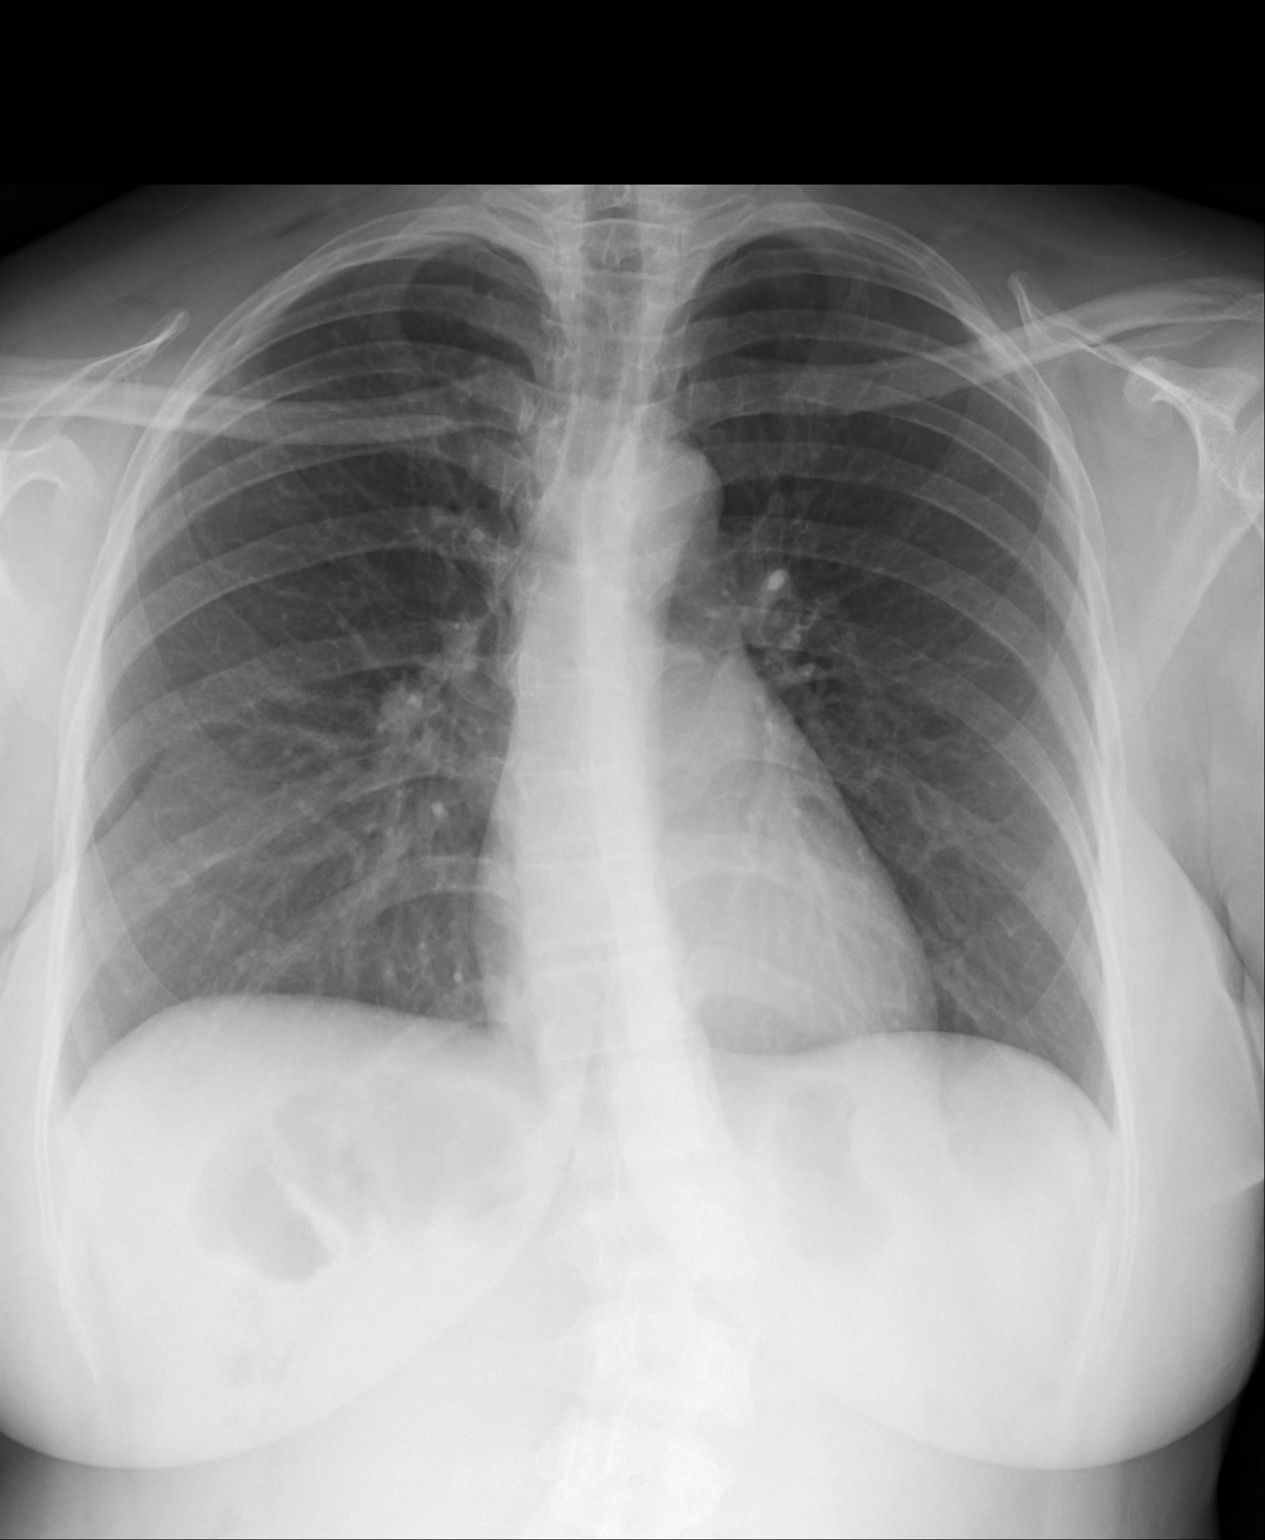
[im 2/2]
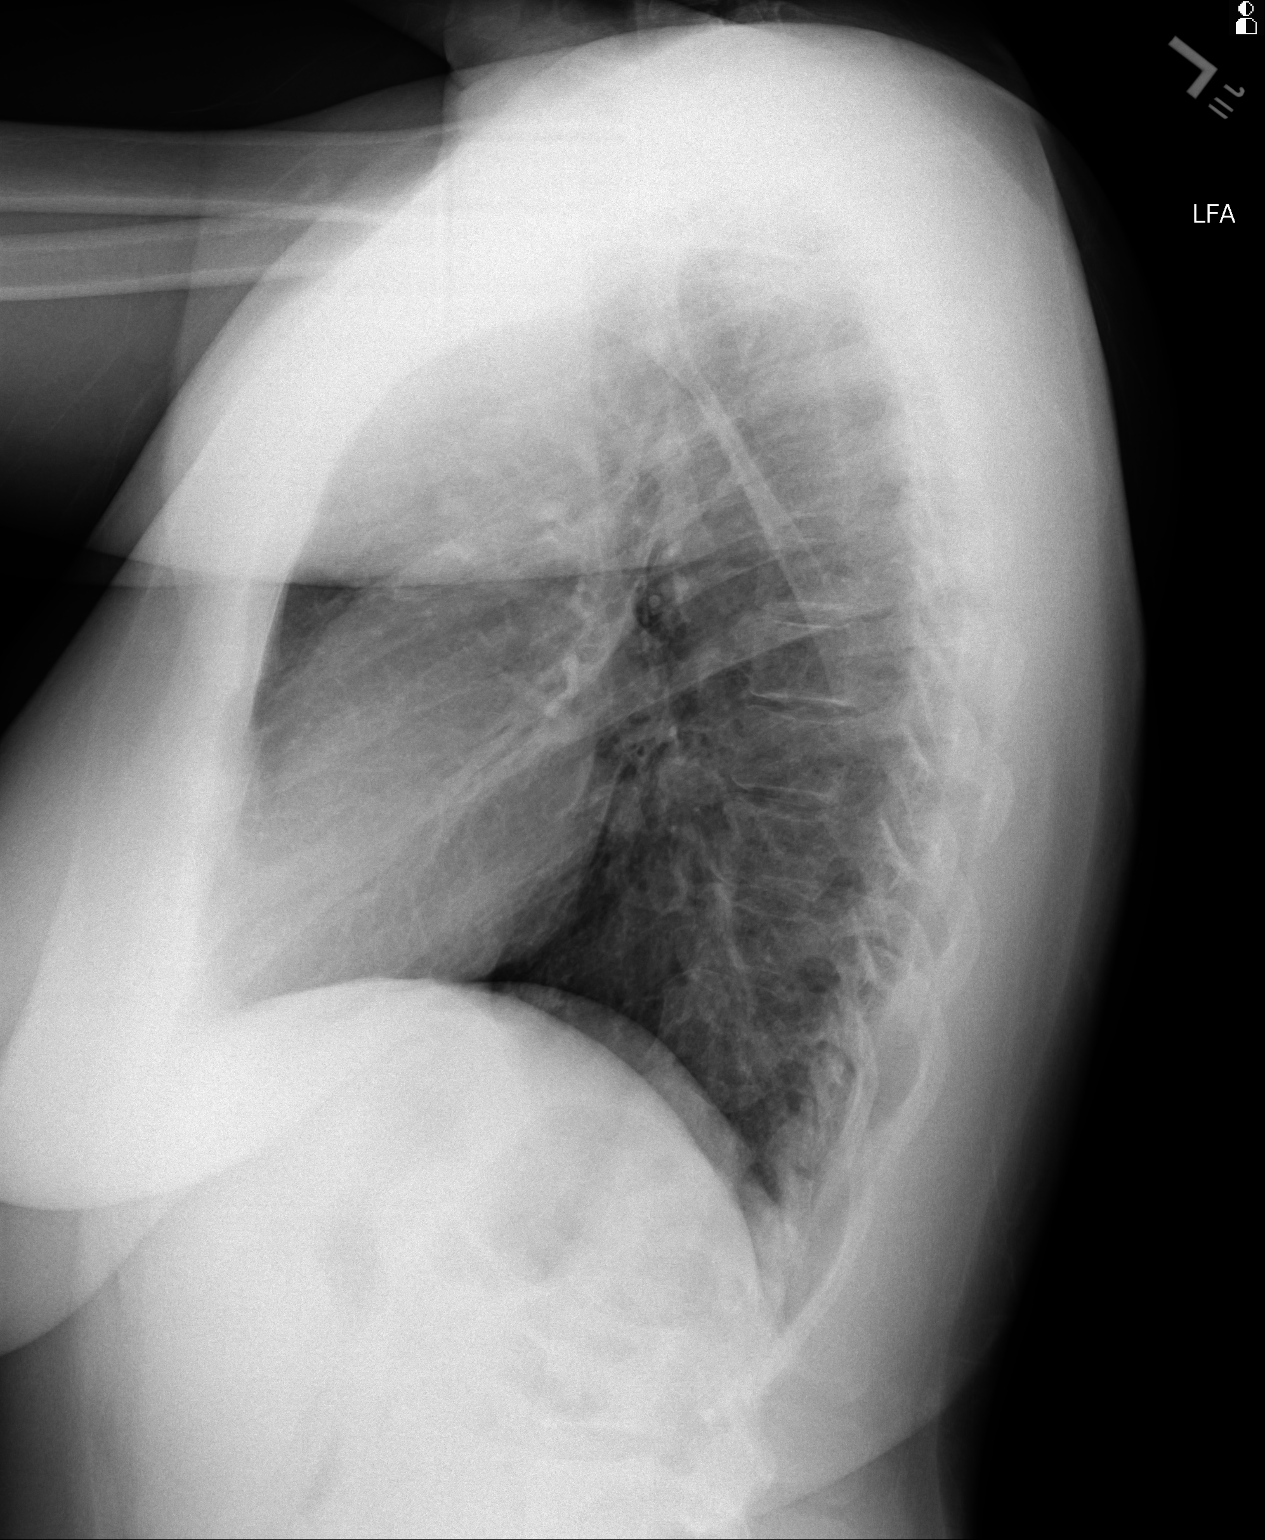

[2 of 2 positions shown; findings below may reference images not displayed]

FINDINGS: The lungs are well-expanded and clear. The cardiopericardial
silhouette is normal in size. The pulmonary vascularity is not
engorged. The mediastinum is normal in width. There is no pleural
effusion or pneumothorax. There is curvature of the mid and lower
thoracic spine with the convexity towards the right.
IMPRESSION: There is no evidence of pneumonia nor CHF nor other acute
cardiopulmonary abnormality.

## 2015-01-02 ENCOUNTER — Encounter: Payer: Self-pay | Admitting: Emergency Medicine

## 2015-01-02 ENCOUNTER — Emergency Department
Admission: EM | Admit: 2015-01-02 | Discharge: 2015-01-02 | Disposition: A | Discharge status: Home / Self Care | Payer: Medicaid Other | Attending: Emergency Medicine | Admitting: Emergency Medicine

## 2015-01-02 ENCOUNTER — Emergency Department: Payer: Medicaid Other

## 2015-01-02 DIAGNOSIS — Y998 Other external cause status: Secondary | ICD-10-CM | POA: Diagnosis not present

## 2015-01-02 DIAGNOSIS — S3992XA Unspecified injury of lower back, initial encounter: Secondary | ICD-10-CM | POA: Diagnosis present

## 2015-01-02 DIAGNOSIS — Y9389 Activity, other specified: Secondary | ICD-10-CM | POA: Insufficient documentation

## 2015-01-02 DIAGNOSIS — I10 Essential (primary) hypertension: Secondary | ICD-10-CM | POA: Diagnosis not present

## 2015-01-02 DIAGNOSIS — S79911A Unspecified injury of right hip, initial encounter: Secondary | ICD-10-CM | POA: Insufficient documentation

## 2015-01-02 DIAGNOSIS — W1830XA Fall on same level, unspecified, initial encounter: Secondary | ICD-10-CM | POA: Diagnosis not present

## 2015-01-02 DIAGNOSIS — Y92009 Unspecified place in unspecified non-institutional (private) residence as the place of occurrence of the external cause: Secondary | ICD-10-CM | POA: Insufficient documentation

## 2015-01-02 DIAGNOSIS — S300XXA Contusion of lower back and pelvis, initial encounter: Secondary | ICD-10-CM | POA: Diagnosis not present

## 2015-01-02 DIAGNOSIS — W19XXXA Unspecified fall, initial encounter: Secondary | ICD-10-CM

## 2015-01-02 MED ORDER — HYDROCODONE-ACETAMINOPHEN 5-325 MG PO TABS: 1.0000 | ORAL_TABLET | ORAL | Status: DC | PRN

## 2015-01-02 MED ORDER — CYCLOBENZAPRINE HCL 10 MG PO TABS: 10.0000 mg | ORAL_TABLET | Freq: Three times a day (TID) | ORAL | Status: DC | PRN

## 2015-01-02 MED ORDER — IBUPROFEN 800 MG PO TABS: 800.0000 mg | ORAL_TABLET | Freq: Three times a day (TID) | ORAL | Status: DC | PRN

## 2015-01-02 MED ORDER — OXYCODONE-ACETAMINOPHEN 5-325 MG PO TABS: 2.0000 | ORAL_TABLET | Freq: Once | ORAL | Status: DC

## 2015-01-02 MED ORDER — KETOROLAC TROMETHAMINE 60 MG/2ML IM SOLN
60.0000 mg | Freq: Once | INTRAMUSCULAR | Status: AC
  Administered 2015-01-02: 60 mg via INTRAMUSCULAR
  Filled 2015-01-02: qty 2

## 2015-01-02 NOTE — Discharge Instructions (Signed)
Contusion A contusion is a deep bruise. Contusions happen when an injury causes bleeding under the skin. Signs of bruising include pain, puffiness (swelling), and discolored skin. The contusion may turn blue, purple, or yellow. HOME CARE   Put ice on the injured area.  Put ice in a plastic bag.  Place a towel between your skin and the bag.  Leave the ice on for 15-20 minutes, 03-04 times a day.  Only take medicine as told by your doctor.  Rest the injured area.  If possible, raise (elevate) the injured area to lessen puffiness. GET HELP RIGHT AWAY IF:   You have more bruising or puffiness.  You have pain that is getting worse.  Your puffiness or pain is not helped by medicine. MAKE SURE YOU:   Understand these instructions.  Will watch your condition.  Will get help right away if you are not doing well or get worse. Document Released: 10/26/2007 Document Revised: 08/01/2011 Document Reviewed: 03/14/2011 [facility] Patient Information 2015 [place], [place]. This information is not intended to replace advice given to you by your health care provider. Make sure you discuss any questions you have with your health care provider.  Tailbone Injury The tailbone is the small bone at the lower end of the backbone (spine). You may have stretched tissues, bruises, or a broken bone (fracture). Most tailbone injuries get better on their own after 4 to 6 weeks. HOME CARE  Put ice on the injured area.  Put ice in a plastic bag.  Place a towel between your skin and the bag.  Leave the ice on for 15-20 minutes. Do this every hour while you are awake for 1 to 2 days.  Sit on a large, rubber or inflated ring or cushion to lessen pain. Lean forward when you sit to help lessen pain.  Avoid sitting in one place for a long time.  Increase your activity as the pain allows.  Only take medicines as told by your doctor.  You can take medicine to help you poop (stool softeners) if it is painful  to poop.  Eat foods with plenty of fiber.  Keep all doctor visits as told. GET HELP RIGHT AWAY IF:  Your pain gets worse.  Pooping causes you pain.  You cannot poop (constipation).  You have a fever. MAKE SURE YOU:  Understand these instructions.  Will watch your condition.  Will get help right away if you are not doing well or get worse. Document Released: 06/11/2010 Document Revised: 08/01/2011 Document Reviewed: 12/02/2010 [facility] Patient Information 2015 [place], [place]. This information is not intended to replace advice given to you by your health care provider. Make sure you discuss any questions you have with your health care provider.

## 2015-01-02 NOTE — ED Provider Notes (Signed)
[facility] Emergency Department Provider Note  ____________________________________________  Time seen: Approximately 11:03 AM  I have reviewed the triage vital signs and the nursing notes.   HISTORY  Chief Complaint Back Pain    HPI Joyce Weber is a 34 y.o. female who presents for evaluation of pain down her lower back hip and pelvis status post falling off a porch one week ago. Patient states the pain is progressively gotten worse through the week. Denies any numbness or tingling still able to ambulate.   Past Medical History  Diagnosis Date  . Hypertension   . Migraines   . A-fib     Patient Active Problem List   Diagnosis Date Noted  . Chest pain 03/28/2013    Past Surgical History  Procedure Laterality Date  . Cardiac catheterization  2009    [facility]  . Foot surgery Bilateral   . Laparoscopy    . Dilation and curettage of uterus      D&C x 2  . Diagnostic laparoscopy  2006    ovarian cystectomy  . Diagnostic laparoscopy  2015    with fulgaration of endometrial implants    Current Outpatient Rx  Name  Route  Sig  Dispense  Refill  . Acetaminophen (TYLENOL PO)   Oral   Take by mouth as needed.         . cyclobenzaprine (FLEXERIL) 10 MG tablet   Oral   Take 1 tablet (10 mg total) by mouth every 8 (eight) hours as needed for muscle spasms.   30 tablet   1   . HYDROcodone-acetaminophen (NORCO) 5-325 MG per tablet   Oral   Take 1-2 tablets by mouth every 4 (four) hours as needed for moderate pain.   8 tablet   0   . ibuprofen (ADVIL,MOTRIN) 800 MG tablet   Oral   Take 1 tablet (800 mg total) by mouth every 8 (eight) hours as needed.   30 tablet   0     Allergies Ivp dye and Shellfish allergy  Family History  Problem Relation Age of Onset  . Hypertension Mother   . Diabetes Mother   . Hypertension Father   . Aneurysm Maternal Aunt   . Aneurysm Maternal Grandmother   . Breast cancer Maternal Grandmother   .  Diabetes Paternal Grandfather   . Kidney disease Paternal Grandfather   . Hypertension Paternal Grandfather     Social History Social History  Substance Use Topics  . Smoking status: Never Smoker   . Smokeless tobacco: None  . Alcohol Use: No     Comment: socially    Review of Systems Constitutional: No fever/chills Eyes: No visual changes. ENT: No sore throat. Cardiovascular: Denies chest pain. Respiratory: Denies shortness of breath. Gastrointestinal: No abdominal pain.  No nausea, no vomiting.  No diarrhea.  No constipation. Genitourinary: Negative for dysuria. Musculoskeletal: Positive for lumbosacral pain. Positive for right hip pain. Skin: Negative for rash. Neurological: Negative for headaches, focal weakness or numbness.  10-point ROS otherwise negative.  ____________________________________________   PHYSICAL EXAM:  VITAL SIGNS: ED Triage Vitals  Enc Vitals Group     BP 01/02/15 1018 141/89 mmHg     Pulse Rate 01/02/15 1018 81     Resp 01/02/15 1018 16     Temp 01/02/15 1018 97.5 F (36.4 C)     Temp Source 01/02/15 1018 Oral     SpO2 01/02/15 1018 100 %     Weight 01/02/15 1018 175 lb (79.379  kg)     Height 01/02/15 1018  (1.702 m)     Head Cir --      Peak Flow --      Pain Score 01/02/15 1010 9     Pain Loc --      Pain Edu? --      Excl. in GC? --     Constitutional: Alert and oriented. Well appearing and in no acute distress. Neck: No stridor.   Cardiovascular: Normal rate, regular rhythm. Grossly normal heart sounds.  Good peripheral circulation. Respiratory: Normal respiratory effort.  No retractions. Lungs CTAB. Gastrointestinal: Soft and nontender. No distention. No abdominal bruits. No CVA tenderness. Musculoskeletal: No lower extremity tenderness nor edema.  No joint effusions. Positive tenderness with pelvic rock on the right hip. Positive lumbar spinal tenderness specifically over the sacrum area Neurologic:  Normal speech and  language. No gross focal neurologic deficits are appreciated. No gait instability. Skin:  Skin is warm, dry and intact. No rash noted. Psychiatric: Mood and affect are normal. Speech and behavior are normal.  ____________________________________________   LABS (all labs ordered are listed, but only abnormal results are displayed)  Labs Reviewed - No data to display ____________________________________________  RADIOLOGY  Negative lumbar sacral coccyx. Negative pelvic and hip. Interpreted by radiologist and reviewed by myself. ____________________________________________   PROCEDURES  Procedure(s) performed: None  Critical Care performed: No  ____________________________________________   INITIAL IMPRESSION / ASSESSMENT AND PLAN / ED COURSE  Pertinent labs & imaging results that were available during my care of the patient were reviewed by me and considered in my medical decision making (see chart for details).  Status post fall with acute low back and hip injury/pain. Rx given for Motrin 800 mg 3 times a day and Flexeril 10 mg 3 times a day. Patient encouraged follow-up with PCP or return to the ER with any worsening symptomology. Patient denies any other emergency medical complaints at this time ____________________________________________   FINAL CLINICAL IMPRESSION(S) / ED DIAGNOSES  Final diagnoses:  Fall as cause of accidental injury in home as place of occurrence  Contusion of lower back, initial encounter      Evangeline Dakin, PA-C 01/02/15 1448  Sharyn Creamer, MD 01/02/15 1550

## 2015-01-02 NOTE — ED Notes (Signed)
Pt to ed with c/o fall off back porch about 1 week ago, since has had lower back pain that radiates down left leg.

## 2016-01-25 IMAGING — US ABDOMEN ULTRASOUND LIMITED
1 series · 14 of 25 positions shown · non-contrast
Comparison: February 16, 2012

CLINICAL DATA: Upper abdominal/ epigastric pain for 1 day. Nausea
for 3 days

EXAM:
US ABDOMEN LIMITED - RIGHT UPPER QUADRANT

[Series 1: abdomen ultrasound limited · 0.18mm/px · 14 of 45 slices shown]
[im 1/45]
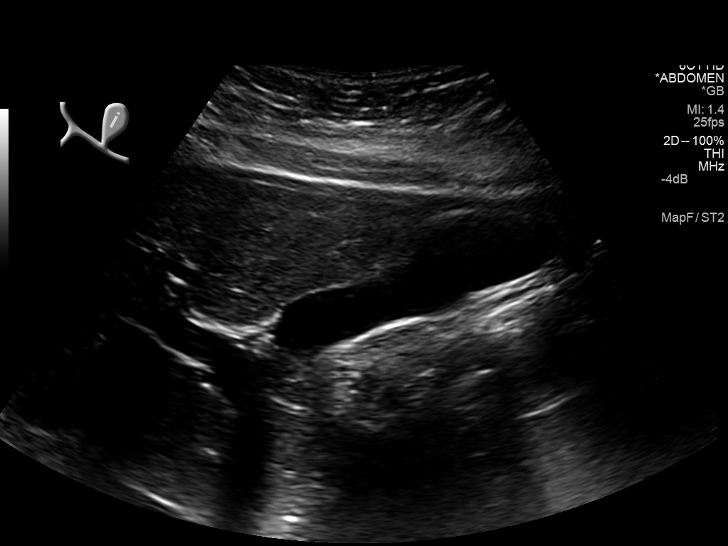
[im 4/45]
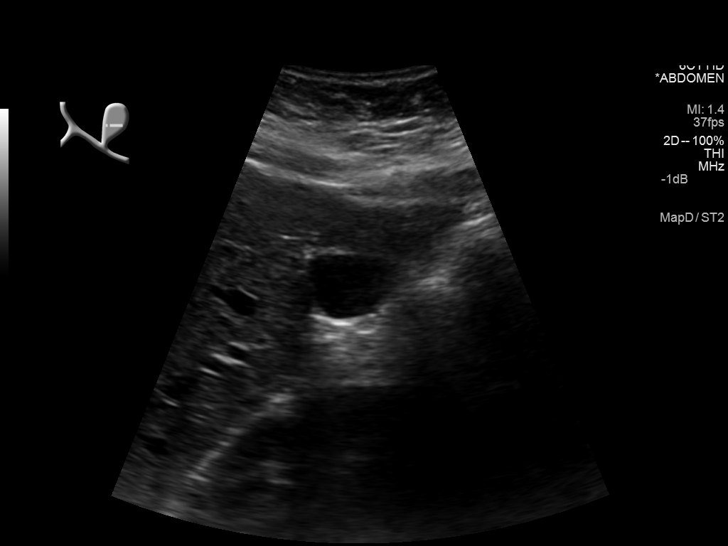
[im 8/45]
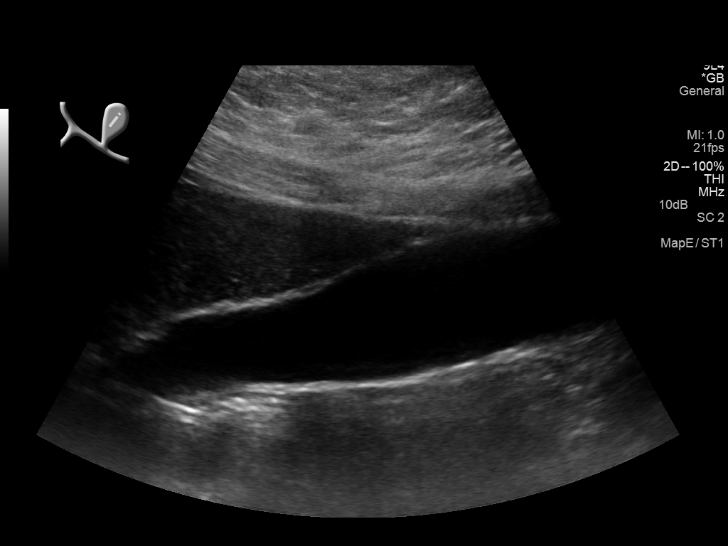
[im 12/45]
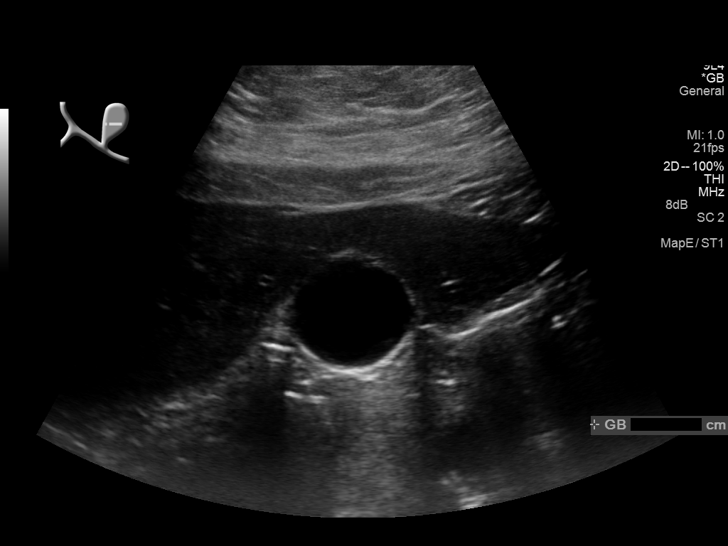
[im 15/45]
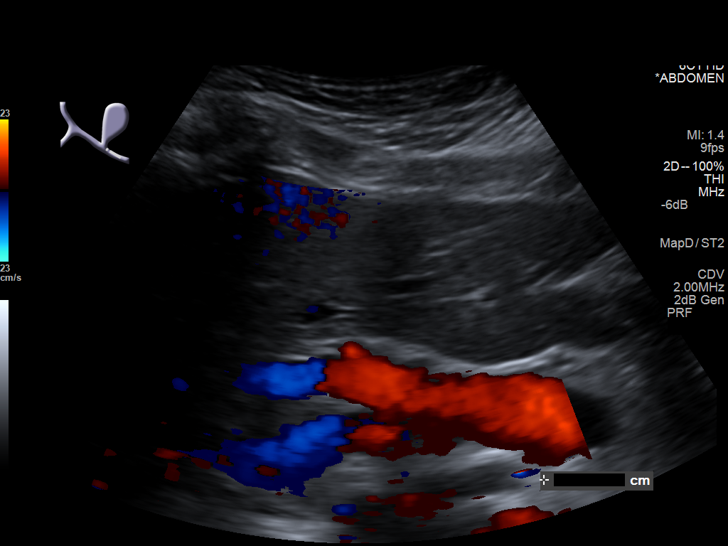
[im 17/45]
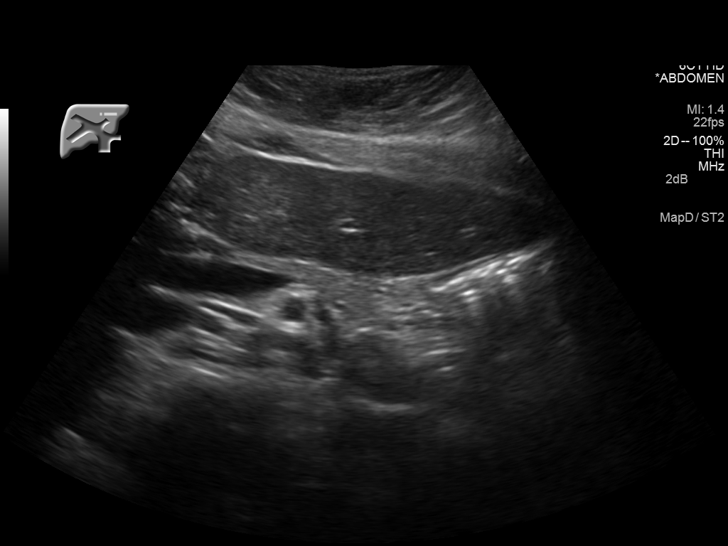
[im 21/45]
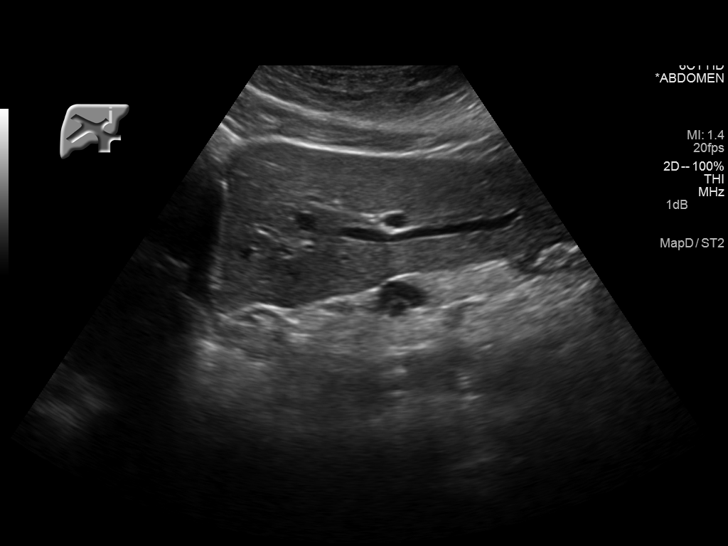
[im 24/45]
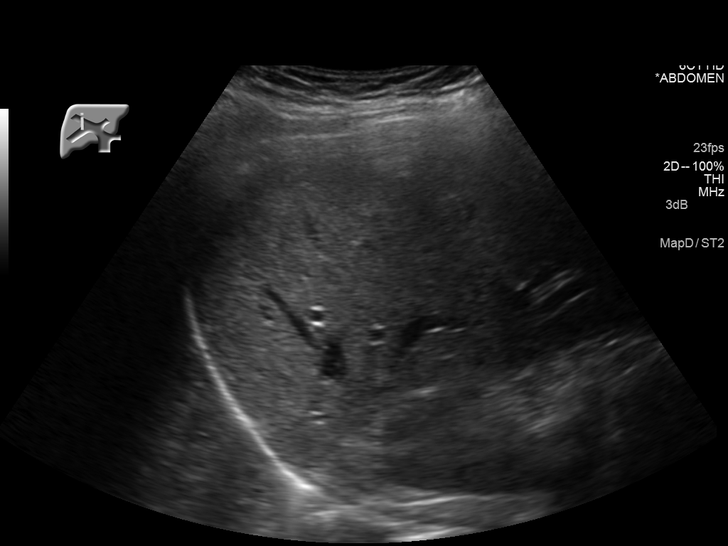
[im 28/45]
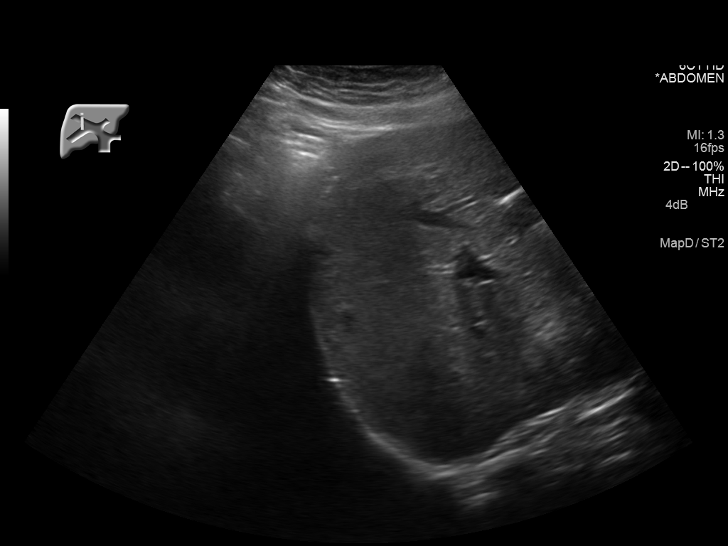
[im 30/45]
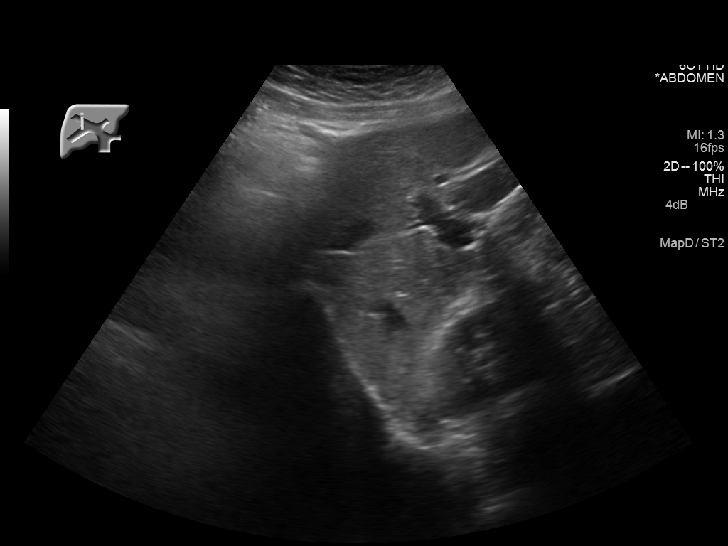
[im 34/45]
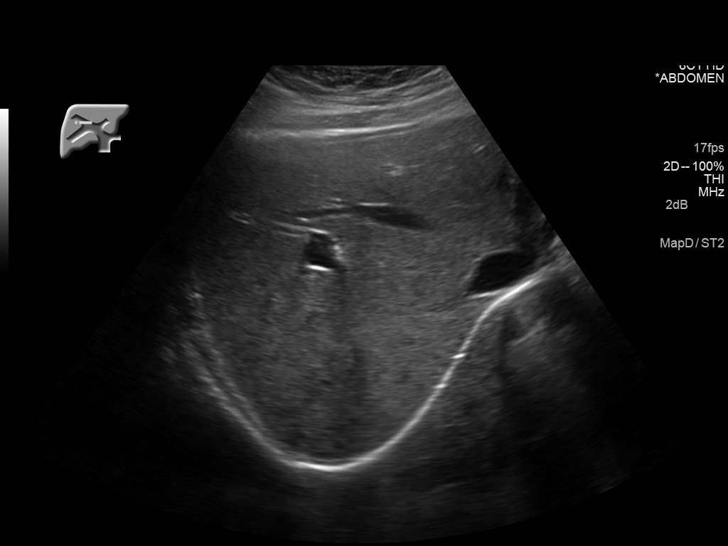
[im 37/45]
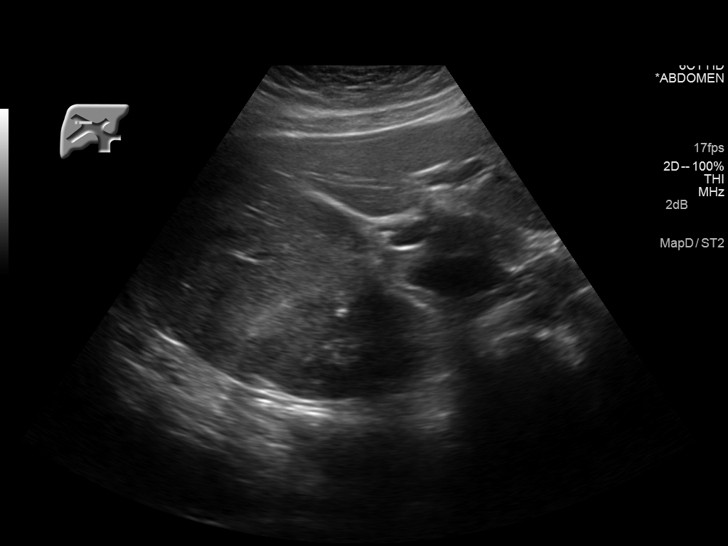
[im 41/45]
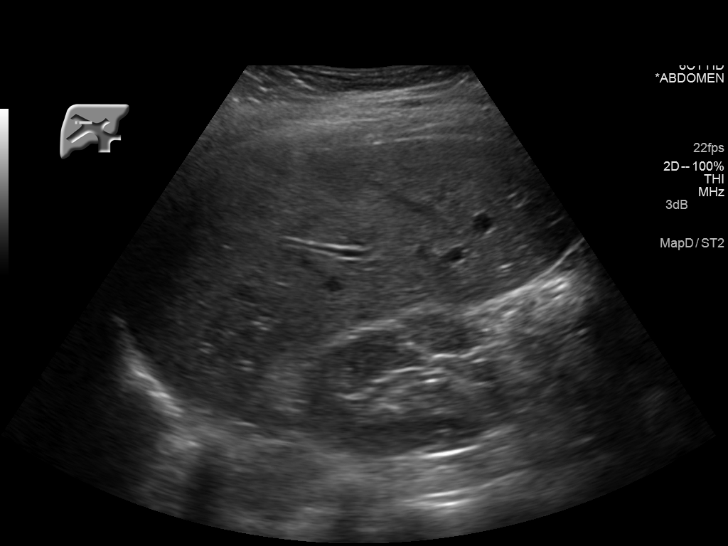
[im 45/45]
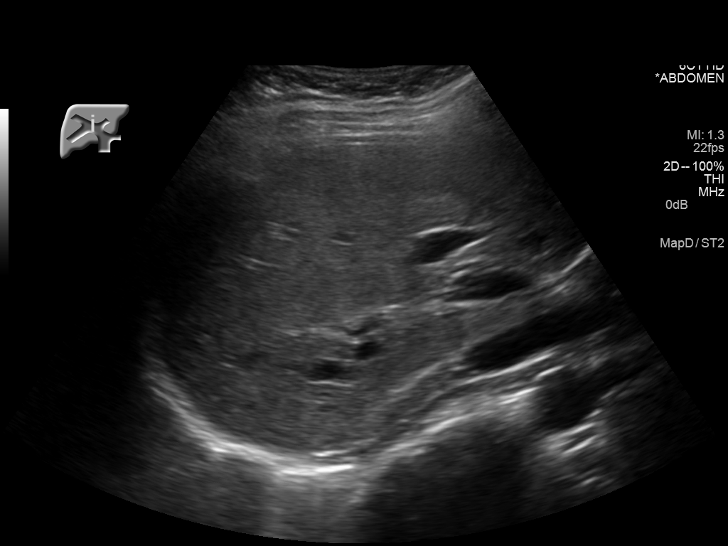

[14 of 25 positions shown; findings below may reference images not displayed]

FINDINGS: Gallbladder:

No gallstones or wall thickening visualized. There is no
pericholecystic fluid. No sonographic Murphy sign noted.

Common bile duct:

Diameter: 3 mm. There is no intrahepatic or extrahepatic biliary
duct dilatation.

Liver:

No focal lesion identified. Within normal limits in parenchymal
echogenicity.
IMPRESSION: Study within normal limits.

## 2016-05-26 ENCOUNTER — Encounter
Admission: EM | Disposition: A | Discharge status: Home / Self Care | Payer: Self-pay | Source: Home / Self Care | Attending: Obstetrics and Gynecology

## 2016-05-26 ENCOUNTER — Ambulatory Visit: Admit: 2016-05-26 | Payer: Medicaid Other | Admitting: Obstetrics and Gynecology

## 2016-05-26 ENCOUNTER — Observation Stay
Admission: EM | Admit: 2016-05-26 | Discharge: 2016-05-27 | Disposition: A | Discharge status: Home / Self Care | Payer: Medicaid Other | Attending: Obstetrics and Gynecology | Admitting: Obstetrics and Gynecology

## 2016-05-26 ENCOUNTER — Encounter: Payer: Self-pay | Admitting: Emergency Medicine

## 2016-05-26 ENCOUNTER — Observation Stay: Payer: Medicaid Other | Admitting: Anesthesiology

## 2016-05-26 ENCOUNTER — Emergency Department: Payer: Medicaid Other

## 2016-05-26 DIAGNOSIS — I4891 Unspecified atrial fibrillation: Secondary | ICD-10-CM | POA: Diagnosis not present

## 2016-05-26 DIAGNOSIS — Z9889 Other specified postprocedural states: Secondary | ICD-10-CM

## 2016-05-26 DIAGNOSIS — O034 Incomplete spontaneous abortion without complication: Secondary | ICD-10-CM

## 2016-05-26 DIAGNOSIS — O021 Missed abortion: Secondary | ICD-10-CM | POA: Diagnosis present

## 2016-05-26 DIAGNOSIS — Z91041 Radiographic dye allergy status: Secondary | ICD-10-CM | POA: Diagnosis not present

## 2016-05-26 DIAGNOSIS — O039 Complete or unspecified spontaneous abortion without complication: Secondary | ICD-10-CM

## 2016-05-26 DIAGNOSIS — I10 Essential (primary) hypertension: Secondary | ICD-10-CM | POA: Diagnosis not present

## 2016-05-26 DIAGNOSIS — Z91013 Allergy to seafood: Secondary | ICD-10-CM | POA: Diagnosis not present

## 2016-05-26 DIAGNOSIS — N939 Abnormal uterine and vaginal bleeding, unspecified: Secondary | ICD-10-CM

## 2016-05-26 HISTORY — PX: DILATION AND EVACUATION: SHX1459

## 2016-05-26 LAB — CBC WITH DIFFERENTIAL/PLATELET
Basophils Absolute: 0 10*3/uL (ref 0–0.1)
Basophils Relative: 1 %
EOS ABS: 0.2 10*3/uL (ref 0–0.7)
Eosinophils Relative: 2 %
HEMATOCRIT: 40.4 % (ref 35.0–47.0)
HEMOGLOBIN: 13.9 g/dL (ref 12.0–16.0)
LYMPHS ABS: 1.9 10*3/uL (ref 1.0–3.6)
Lymphocytes Relative: 25 %
MCH: 32.4 pg (ref 26.0–34.0)
MCHC: 34.4 g/dL (ref 32.0–36.0)
MCV: 94.3 fL (ref 80.0–100.0)
MONO ABS: 0.8 10*3/uL (ref 0.2–0.9)
MONOS PCT: 11 %
NEUTROS PCT: 61 %
Neutro Abs: 4.7 10*3/uL (ref 1.4–6.5)
Platelets: 223 10*3/uL (ref 150–440)
RBC: 4.28 MIL/uL (ref 3.80–5.20)
RDW: 12.2 % (ref 11.5–14.5)
WBC: 7.7 10*3/uL (ref 3.6–11.0)

## 2016-05-26 LAB — URINALYSIS, COMPLETE (UACMP) WITH MICROSCOPIC
Bilirubin Urine: NEGATIVE
GLUCOSE, UA: NEGATIVE mg/dL
Ketones, ur: NEGATIVE mg/dL
NITRITE: NEGATIVE
PROTEIN: NEGATIVE mg/dL
Specific Gravity, Urine: 1.009 (ref 1.005–1.030)
pH: 6 (ref 5.0–8.0)

## 2016-05-26 LAB — WET PREP, GENITAL
Clue Cells Wet Prep HPF POC: NONE SEEN
SPERM: NONE SEEN
TRICH WET PREP: NONE SEEN
YEAST WET PREP: NONE SEEN

## 2016-05-26 LAB — POCT PREGNANCY, URINE: Preg Test, Ur: POSITIVE — AB

## 2016-05-26 LAB — ABO/RH: ABO/RH(D): B POS

## 2016-05-26 LAB — CHLAMYDIA/NGC RT PCR (ARMC ONLY)
Chlamydia Tr: NOT DETECTED
N gonorrhoeae: NOT DETECTED

## 2016-05-26 LAB — HCG, QUANTITATIVE, PREGNANCY: HCG, BETA CHAIN, QUANT, S: 10590 m[IU]/mL — AB (ref <5)

## 2016-05-26 SURGERY — DILATION AND EVACUATION, UTERUS
Anesthesia: General | Site: Vagina | Wound class: Clean Contaminated

## 2016-05-26 MED ORDER — PHENYLEPHRINE 40 MCG/ML (10ML) SYRINGE FOR IV PUSH (FOR BLOOD PRESSURE SUPPORT)
PREFILLED_SYRINGE | INTRAVENOUS | Status: AC
  Filled 2016-05-26: qty 10

## 2016-05-26 MED ORDER — MIDAZOLAM HCL 2 MG/2ML IJ SOLN
INTRAMUSCULAR | Status: DC | PRN
  Administered 2016-05-26: 2 mg via INTRAVENOUS

## 2016-05-26 MED ORDER — MORPHINE SULFATE (PF) 4 MG/ML IV SOLN
4.0000 mg | Freq: Once | INTRAVENOUS | Status: AC
  Administered 2016-05-26: 4 mg via INTRAVENOUS
  Filled 2016-05-26: qty 1

## 2016-05-26 MED ORDER — KETOROLAC TROMETHAMINE 30 MG/ML IJ SOLN
INTRAMUSCULAR | Status: AC
  Administered 2016-05-26: 30 mg via INTRAVENOUS
  Filled 2016-05-26: qty 1

## 2016-05-26 MED ORDER — FENTANYL CITRATE (PF) 100 MCG/2ML IJ SOLN
25.0000 ug | INTRAMUSCULAR | Status: DC | PRN
  Administered 2016-05-26 – 2016-05-27 (×4): 25 ug via INTRAVENOUS

## 2016-05-26 MED ORDER — DEXAMETHASONE SODIUM PHOSPHATE 10 MG/ML IJ SOLN
INTRAMUSCULAR | Status: DC | PRN
  Administered 2016-05-26: 10 mg via INTRAVENOUS

## 2016-05-26 MED ORDER — KETOROLAC TROMETHAMINE 30 MG/ML IJ SOLN
30.0000 mg | Freq: Once | INTRAMUSCULAR | Status: AC
  Administered 2016-05-26: 30 mg via INTRAVENOUS

## 2016-05-26 MED ORDER — SUCCINYLCHOLINE CHLORIDE 200 MG/10ML IV SOSY
PREFILLED_SYRINGE | INTRAVENOUS | Status: AC
  Filled 2016-05-26: qty 10

## 2016-05-26 MED ORDER — FENTANYL CITRATE (PF) 100 MCG/2ML IJ SOLN
INTRAMUSCULAR | Status: AC
  Administered 2016-05-26: 25 ug via INTRAVENOUS
  Filled 2016-05-26: qty 2

## 2016-05-26 MED ORDER — MIDAZOLAM HCL 2 MG/2ML IJ SOLN
INTRAMUSCULAR | Status: AC
  Filled 2016-05-26: qty 2

## 2016-05-26 MED ORDER — ONDANSETRON HCL 4 MG/2ML IJ SOLN
4.0000 mg | Freq: Four times a day (QID) | INTRAMUSCULAR | Status: DC | PRN
  Administered 2016-05-26: 4 mg via INTRAVENOUS
  Filled 2016-05-26 (×2): qty 2

## 2016-05-26 MED ORDER — DOXYCYCLINE HYCLATE 100 MG IV SOLR
100.0000 mg | Freq: Once | INTRAVENOUS | Status: AC
  Administered 2016-05-26: 100 mg via INTRAVENOUS
  Filled 2016-05-26 (×2): qty 100

## 2016-05-26 MED ORDER — SUCCINYLCHOLINE CHLORIDE 20 MG/ML IJ SOLN
INTRAMUSCULAR | Status: DC | PRN
Start: 2016-05-26 — End: 2016-05-26
  Administered 2016-05-26: 100 mg via INTRAVENOUS

## 2016-05-26 MED ORDER — PROPOFOL 10 MG/ML IV BOLUS
INTRAVENOUS | Status: AC
  Filled 2016-05-26: qty 20

## 2016-05-26 MED ORDER — LACTATED RINGERS IV SOLN
INTRAVENOUS | Status: DC | PRN
  Administered 2016-05-26: 23:00:00 via INTRAVENOUS

## 2016-05-26 MED ORDER — ONDANSETRON HCL 4 MG/2ML IJ SOLN
INTRAMUSCULAR | Status: DC | PRN
  Administered 2016-05-26: 4 mg via INTRAVENOUS

## 2016-05-26 MED ORDER — MORPHINE SULFATE (PF) 2 MG/ML IV SOLN
INTRAVENOUS | Status: AC
  Administered 2016-05-26: 2 mg via INTRAVENOUS
  Filled 2016-05-26: qty 1

## 2016-05-26 MED ORDER — DEXAMETHASONE SODIUM PHOSPHATE 10 MG/ML IJ SOLN
INTRAMUSCULAR | Status: AC
  Filled 2016-05-26: qty 1

## 2016-05-26 MED ORDER — FENTANYL CITRATE (PF) 100 MCG/2ML IJ SOLN
INTRAMUSCULAR | Status: DC | PRN
  Administered 2016-05-26: 100 ug via INTRAVENOUS

## 2016-05-26 MED ORDER — LIDOCAINE 2% (20 MG/ML) 5 ML SYRINGE
INTRAMUSCULAR | Status: AC
  Filled 2016-05-26: qty 5

## 2016-05-26 MED ORDER — EPHEDRINE 5 MG/ML INJ
INTRAVENOUS | Status: AC
  Filled 2016-05-26: qty 10

## 2016-05-26 MED ORDER — MORPHINE SULFATE (PF) 2 MG/ML IV SOLN
2.0000 mg | Freq: Once | INTRAVENOUS | Status: AC
  Administered 2016-05-26: 2 mg via INTRAVENOUS

## 2016-05-26 MED ORDER — SODIUM CHLORIDE 0.9 % IV SOLN
INTRAVENOUS | Status: DC
  Administered 2016-05-26: 21:00:00 via INTRAVENOUS

## 2016-05-26 MED ORDER — PROPOFOL 10 MG/ML IV BOLUS
INTRAVENOUS | Status: DC | PRN
  Administered 2016-05-26: 150 mg via INTRAVENOUS

## 2016-05-26 MED ORDER — ONDANSETRON HCL 4 MG/2ML IJ SOLN: 4.0000 mg | Freq: Once | INTRAMUSCULAR | Status: DC | PRN

## 2016-05-26 MED ORDER — HYDROMORPHONE HCL 1 MG/ML IJ SOLN
1.0000 mg | Freq: Once | INTRAMUSCULAR | Status: AC
  Administered 2016-05-26: 1 mg via INTRAVENOUS

## 2016-05-26 MED ORDER — HYDROMORPHONE HCL 1 MG/ML IJ SOLN
INTRAMUSCULAR | Status: AC
  Administered 2016-05-26: 1 mg via INTRAVENOUS
  Filled 2016-05-26: qty 1

## 2016-05-26 MED ORDER — FENTANYL CITRATE (PF) 100 MCG/2ML IJ SOLN
INTRAMUSCULAR | Status: AC
  Filled 2016-05-26: qty 2

## 2016-05-26 MED ORDER — ONDANSETRON HCL 4 MG/2ML IJ SOLN
INTRAMUSCULAR | Status: AC
  Filled 2016-05-26: qty 2

## 2016-05-26 MED ORDER — LIDOCAINE HCL (CARDIAC) 20 MG/ML IV SOLN
INTRAVENOUS | Status: DC | PRN
  Administered 2016-05-26: 100 mg via INTRAVENOUS

## 2016-05-26 SURGICAL SUPPLY — 18 items
CATH ROBINSON RED A/P 16FR (CATHETERS) ×2 IMPLANT
FILTER UTR ASPR SPEC (MISCELLANEOUS) ×1 IMPLANT
FLTR UTR ASPR SPEC (MISCELLANEOUS) ×2
GLOVE BIO SURGEON STRL SZ7 (GLOVE) ×8 IMPLANT
GOWN STRL REUS W/ TWL LRG LVL3 (GOWN DISPOSABLE) ×2 IMPLANT
GOWN STRL REUS W/TWL LRG LVL3 (GOWN DISPOSABLE) ×2
KIT BERKELEY 1ST TRIMESTER 3/8 (MISCELLANEOUS) ×2 IMPLANT
KIT RM TURNOVER CYSTO AR (KITS) ×2 IMPLANT
NS IRRIG 500ML POUR BTL (IV SOLUTION) ×2 IMPLANT
PACK DNC HYST (MISCELLANEOUS) ×2 IMPLANT
PAD OB MATERNITY 4.3X12.25 (PERSONAL CARE ITEMS) ×2 IMPLANT
PAD PREP 24X41 OB/GYN DISP (PERSONAL CARE ITEMS) ×2 IMPLANT
SET BERKELEY SUCTION TUBING (SUCTIONS) ×2 IMPLANT
TOWEL OR 17X26 4PK STRL BLUE (TOWEL DISPOSABLE) ×2 IMPLANT
VACURETTE 10 RIGID CVD (CANNULA) IMPLANT
VACURETTE 12 RIGID CVD (CANNULA) IMPLANT
VACURETTE 8 RIGID CVD (CANNULA) IMPLANT
VACURETTE 8MM F TIP (MISCELLANEOUS) ×2 IMPLANT

## 2016-05-26 NOTE — Anesthesia Procedure Notes (Signed)
Procedure Name: Intubation Date/Time: 05/26/2016 11:02 PM Performed by: Aline Brochure Pre-anesthesia Checklist: Patient identified, Emergency Drugs available, Suction available and Patient being monitored Patient Re-evaluated:Patient Re-evaluated prior to inductionOxygen Delivery Method: Circle system utilized Preoxygenation: Pre-oxygenation with 100% oxygen Intubation Type: IV induction, Rapid sequence and Cricoid Pressure applied Laryngoscope Size: Mac and 3 Grade View: Grade II Tube type: Oral Tube size: 7.0 mm Number of attempts: 1 Airway Equipment and Method: Stylet Placement Confirmation: positive ETCO2 and breath sounds checked- equal and bilateral Secured at: 21 cm Tube secured with: Tape Dental Injury: Teeth and Oropharynx as per pre-operative assessment

## 2016-05-26 NOTE — Anesthesia Preprocedure Evaluation (Signed)
Anesthesia Evaluation  Patient identified by MRN, date of birth, ID band Patient awake    Reviewed: Allergy & Precautions, NPO status , Patient's Chart, lab work & pertinent test results  History of Anesthesia Complications Negative for: history of anesthetic complications  Airway Mallampati: II       Dental   Pulmonary neg pulmonary ROS,           Cardiovascular hypertension (hx, no meds now), + dysrhythmias (hx of a solo episode, broke with meds, no meds now) Atrial Fibrillation      Neuro/Psych negative neurological ROS     GI/Hepatic negative GI ROS, Neg liver ROS,   Endo/Other  negative endocrine ROS  Renal/GU negative Renal ROS     Musculoskeletal   Abdominal   Peds  Hematology   Anesthesia Other Findings   Reproductive/Obstetrics (+) Pregnancy                             Anesthesia Physical Anesthesia Plan  ASA: II and emergent  Anesthesia Plan: General   Post-op Pain Management:    Induction: Intravenous and Rapid sequence  Airway Management Planned: Oral ETT  Additional Equipment:   Intra-op Plan:   Post-operative Plan:   Informed Consent: I have reviewed the patients History and Physical, chart, labs and discussed the procedure including the risks, benefits and alternatives for the proposed anesthesia with the patient or authorized representative who has indicated his/her understanding and acceptance.     Plan Discussed with:   Anesthesia Plan Comments:         Anesthesia Quick Evaluation

## 2016-05-26 NOTE — ED Notes (Signed)
This RN to bedside at this time. Pt reports feeling shaky due to pain, states she never received relief from morphine she received at 1647.

## 2016-05-26 NOTE — H&P (Signed)
Obstetrics & Gynecology Consult H&P    Chief Complaint: Cramping and bleeding   History of Present Illness: Patient is a 36 y.o. [id number] presenting with cramping and bleeding to the emergency department.  Evaluation in the ED revealed a missed abortion at 8 week by CRL.  The patient is Rh positive.  Past medical history notable for atrial fibrillation per [facility] records and endometriosis.  Patient did not self report history of atrial fibrillation. She has undergone prior D&E at 18 weeks for intractable nausea and emesis approximately 1 year ago.     Review of Systems:10 point review of systems  Past Medical History:  Past Medical History:  Diagnosis Date  . A-fib (HCC)   . Hypertension   . Migraines     Past Surgical History:  Past Surgical History:  Procedure Laterality Date  . CARDIAC CATHETERIZATION  2009   [facility]  . DIAGNOSTIC LAPAROSCOPY  2006   ovarian cystectomy  . DIAGNOSTIC LAPAROSCOPY  2015   with fulgaration of endometrial implants  . DILATION AND CURETTAGE OF UTERUS     D&C x 2  . FOOT SURGERY Bilateral   . LAPAROSCOPY      Family History:  Family History  Problem Relation Age of Onset  . Hypertension Mother   . Diabetes Mother   . Hypertension Father   . Aneurysm Maternal Aunt   . Aneurysm Maternal Grandmother   . Breast cancer Maternal Grandmother   . Diabetes Paternal Grandfather   . Kidney disease Paternal Grandfather   . Hypertension Paternal Grandfather     Social History:  Social History   Social History  . Marital status: Single    Spouse name: N/A  . Number of children: N/A  . Years of education: N/A   Occupational History  . Not on file.   Social History Main Topics  . Smoking status: Never Smoker  . Smokeless tobacco: Not on file  . Alcohol use No     Comment: socially  . Drug use: No  . Sexual activity: Yes   Other Topics Concern  . Not on file   Social History Narrative  . No narrative on file    Allergies:  Allergies   Allergen Reactions  . Ivp Dye [Iodinated Diagnostic Agents]   . Shellfish Allergy     Medications: Prior to Admission medications   Medication Sig Start Date End Date Taking? Authorizing Provider  Acetaminophen (TYLENOL PO) Take by mouth as needed.    Historical Provider, MD  cyclobenzaprine (FLEXERIL) 10 MG tablet Take 1 tablet (10 mg total) by mouth every 8 (eight) hours as needed for muscle spasms. 01/02/15   Evangeline Dakin, PA-C  HYDROcodone-acetaminophen (NORCO) 5-325 MG per tablet Take 1-2 tablets by mouth every 4 (four) hours as needed for moderate pain. 01/02/15   Charmayne Sheer Beers, PA-C  ibuprofen (ADVIL,MOTRIN) 800 MG tablet Take 1 tablet (800 mg total) by mouth every 8 (eight) hours as needed. 01/02/15   Evangeline Dakin, PA-C    Physical Exam Vitals: Blood pressure 139/83, pulse 91, temperature 98.1 F (36.7 C), temperature source Oral, resp. rate 15, height 5' 7.5" (1.715 m), weight 176 lb (79.8 kg), last menstrual period 03/09/2016, SpO2 100 %, unknown if currently breastfeeding. General: NAD HEENT: normocephalic, anicteric Pulmonary: CTAB Cardiovascular: RRR Abdomen: gravid, non-tender Genitourinary: minimal bleeding and cervix closed per ED physician Extremities: no edema Neurologic: grossly intact Psychiatric: mood appropriate, affect full  Labs: Results for orders placed or performed during the hospital  encounter of 05/26/16 (from the past 72 hour(s))  Urinalysis, Complete w Microscopic     Status: Abnormal   Collection Time: 05/26/16  2:59 PM  Result Value Ref Range   Color, Urine YELLOW (A) YELLOW   APPearance CLEAR (A) CLEAR   Specific Gravity, Urine 1.009 1.005 - 1.030   pH 6.0 5.0 - 8.0   Glucose, UA NEGATIVE NEGATIVE mg/dL   Hgb urine dipstick LARGE (A) NEGATIVE   Bilirubin Urine NEGATIVE NEGATIVE   Ketones, ur NEGATIVE NEGATIVE mg/dL   Protein, ur NEGATIVE NEGATIVE mg/dL   Nitrite NEGATIVE NEGATIVE   Leukocytes, UA TRACE (A) NEGATIVE   RBC / HPF 6-30 0  - 5 RBC/hpf   WBC, UA 0-5 0 - 5 WBC/hpf   Bacteria, UA RARE (A) NONE SEEN   Squamous Epithelial / LPF 0-5 (A) NONE SEEN   Mucous PRESENT   ABO/Rh     Status: None   Collection Time: 05/26/16  2:59 PM  Result Value Ref Range   ABO/RH(D) B POS   Pregnancy, urine POC     Status: Abnormal   Collection Time: 05/26/16  3:04 PM  Result Value Ref Range   Preg Test, Ur POSITIVE (A) NEGATIVE    Comment:        THE SENSITIVITY OF THIS METHODOLOGY IS >24 mIU/mL   CBC with Differential     Status: None   Collection Time: 05/26/16  3:51 PM  Result Value Ref Range   WBC 7.7 3.6 - 11.0 K/uL   RBC 4.28 3.80 - 5.20 MIL/uL   Hemoglobin 13.9 12.0 - 16.0 g/dL   HCT 16.140.4 09.635.0 - 04.547.0 %   MCV 94.3 80.0 - 100.0 fL   MCH 32.4 26.0 - 34.0 pg   MCHC 34.4 32.0 - 36.0 g/dL   RDW 40.912.2 81.111.5 - 91.414.5 %   Platelets 223 150 - 440 K/uL   Neutrophils Relative % 61 %   Neutro Abs 4.7 1.4 - 6.5 K/uL   Lymphocytes Relative 25 %   Lymphs Abs 1.9 1.0 - 3.6 K/uL   Monocytes Relative 11 %   Monocytes Absolute 0.8 0.2 - 0.9 K/uL   Eosinophils Relative 2 %   Eosinophils Absolute 0.2 0 - 0.7 K/uL   Basophils Relative 1 %   Basophils Absolute 0.0 0 - 0.1 K/uL  hCG, quantitative, pregnancy     Status: Abnormal   Collection Time: 05/26/16  3:51 PM  Result Value Ref Range   hCG, Beta Chain, Quant, S 10,590 (H) <5 mIU/mL    Comment:          GEST. AGE      CONC.  (mIU/mL)   <=1 WEEK        5 - 50     2 WEEKS       50 - 500     3 WEEKS       100 - 10,000     4 WEEKS     1,000 - 30,000     5 WEEKS     3,500 - 115,000   6-8 WEEKS     12,000 - 270,000    12 WEEKS     15,000 - 220,000        FEMALE AND NON-PREGNANT FEMALE:     LESS THAN 5 mIU/mL   Wet prep, genital     Status: Abnormal   Collection Time: 05/26/16  6:04 PM  Result Value Ref Range   Yeast Wet Prep HPF POC  NONE SEEN NONE SEEN   Trich, Wet Prep NONE SEEN NONE SEEN   Clue Cells Wet Prep HPF POC NONE SEEN NONE SEEN   WBC, Wet Prep HPF POC FEW (A) NONE  SEEN   Sperm NONE SEEN     Imaging US Ob Comp Less 14 Wks  Result Date: 05/26/2016 CLINICAL DATA:  Lower abdominal cramping and vaginal bleeding for 2 days EXAM: OBSTETRIC <14 WK [place] AND TRANSVAGINAL OB US TECHNIQUE: Both transabdominal and transvaginal ultrasound examinations were performed for complete evaluation of the gestation as well as the maternal uterus, adnexal regions, and pelvic cul-de-sac. Transvaginal technique was performed to assess early pregnancy. COMPARISON:  None. FINDINGS: Intrauterine gestational sac: Single Yolk sac:  Visualized Embryo:  Visualized Cardiac Activity: Not visualized Heart Rate:   bpm MSD:   mm    w     d CRL:  20.1  mm   8 w   4 d                  [place] EDC: 01/01/2017 Subchorionic hemorrhage:  None visualized. Maternal uterus/adnexae: 3 cm cyst noted in the right ovary. Small thin internal septations. No mural nodularity or internal blood flow. No free fluid. IMPRESSION: Eight week 4 day intrauterine pregnancy based on crown-rump length. No fetal cardiac activity detected. Findings meet definitive criteria for failed pregnancy. This follows SRU consensus guidelines: Diagnostic Criteria for Nonviable Pregnancy Early in the First Trimester. N Engl J Med [phone]. 3 cm minimally complex benign appearing right ovarian cyst. Electronically Signed   By: Charlett Nose M.D.   On: 05/26/2016 17:37   US Ob Transvaginal  Result Date: 05/26/2016 CLINICAL DATA:  Lower abdominal cramping and vaginal bleeding for 2 days EXAM: OBSTETRIC <14 WK [place] AND TRANSVAGINAL OB US TECHNIQUE: Both transabdominal and transvaginal ultrasound examinations were performed for complete evaluation of the gestation as well as the maternal uterus, adnexal regions, and pelvic cul-de-sac. Transvaginal technique was performed to assess early pregnancy. COMPARISON:  None. FINDINGS: Intrauterine gestational sac: Single Yolk sac:  Visualized Embryo:  Visualized Cardiac Activity: Not visualized Heart Rate:    bpm MSD:   mm    w     d CRL:  20.1  mm   8 w   4 d                  [place] EDC: 01/01/2017 Subchorionic hemorrhage:  None visualized. Maternal uterus/adnexae: 3 cm cyst noted in the right ovary. Small thin internal septations. No mural nodularity or internal blood flow. No free fluid. IMPRESSION: Eight week 4 day intrauterine pregnancy based on crown-rump length. No fetal cardiac activity detected. Findings meet definitive criteria for failed pregnancy. This follows SRU consensus guidelines: Diagnostic Criteria for Nonviable Pregnancy Early in the First Trimester. N Engl J Med [phone]. 3 cm minimally complex benign appearing right ovarian cyst. Electronically Signed   By: Charlett Nose M.D.   On: 05/26/2016 17:37    Assessment: 36 y.o. [id number] with 8 week missed abortion  Plan: 1) 8 week missed abortion - significant cramping, mild bleeding.  Patient declines expectant or medical management and desires to proceed to OR for D&C.   - Rh positive rhogam not indicated - Discussed bleeding risk and potential for blood transfusion and its risks and benefits - Doxycyline for antibiotic ppx - Iodine allergy so no betadine for vaginal prep

## 2016-05-26 NOTE — Progress Notes (Signed)
Consent signed for D&C. Pt down to OR via bed by orderly at 2245. OR states will have antibiotic ready for her when she gets there. Has been NPO since 1400 per pt.

## 2016-05-26 NOTE — ED Notes (Signed)
The OR called up to say that since this patient has been drinking Pepsi, they will not be able to take her until 1030.

## 2016-05-26 NOTE — ED Triage Notes (Signed)
Pt c/o lower abdominal cramping and vaginal bleeding. Last normal period October, mild spotting in November. Then started having vaginal bleeding jan 2 with clots which pt reports is abnormal for her. C/o lower abdominal cramping.

## 2016-05-26 NOTE — Op Note (Signed)
Patient Name: Joyce GrieveQuenclyn Russon Date of Procedure: @TODAY @  Preoperative Diagnosis: 1) 36 y.o. with 8 week missed abortion  Postoperative Diagnosis: 1) 36 y.o. with 8 week missed abortion  Operation Performed: Suction dilation and curettage  Indication: 8 week missed abortion desiring definitive surgical managment  Anesthesia: .General  Primary Surgeon: Vena AustriaAndreas Jamiria Langill, MD  Assistant: none  Preoperative Antibiotics: none  Estimated Blood Loss: 50mL  IV Fluids: 250mL  Urine Output:: ~21600mL straight cath  Drains or Tubes: none  Implants: none  Specimens Removed: Products of conception  Complications: none  Intraoperative Findings:  8 week size anteverted uterus, external cervix dilated 1cm  Patient Condition: stable  Procedure in Detail:  Patient was taken to the operating room were she was administered general endotracheal anesthesia.  She was positioned in the dorsal lithotomy position utilizing Allen stirups, prepped and draped in the usual sterile fashion.  Uterus was noted to be 8 week in size, anteverted, with cervix dilated 1cm.   Prior to proceeding with the case a time out was performed.    Attention was turned to the patient's pelvis.  A red rubber catheter was used to empty the patient's bladder.  An operative speculum was placed to allow visualization of the cervix.  The anterior lip of the cervix was grasped with a ring forceps and the cervix was sequentially dilated using pratt dilators.  8mm flexible suction curette was advanced to uterine fundus and several passes were undertaken. Sharp curettage was then performed noting good uterine cry throughout, a final pass with the suction curette was undertaken and the resulting specimen collected and sent to pathology.    The ring forceps was removed from the cervix.  The cervix were noted to be  Hemostatic before removing the operative speculum.  Sponge needle and instrument counts were corrects times two.  The  patient tolerated the procedure well and was taken to the recovery room in stable condition.

## 2016-05-26 NOTE — Transfer of Care (Signed)
Immediate Anesthesia Transfer of Care Note  Patient: Joyce Weber  Procedure(s) Performed: Procedure(s): DILATATION AND EVACUATION (N/A)  Patient Location: PACU  Anesthesia Type:General  Level of Consciousness: awake  Airway & Oxygen Therapy: Patient Spontanous Breathing  Post-op Assessment: Post -op Vital signs reviewed and stable  Post vital signs: stable  Last Vitals:  Vitals:   05/26/16 2108 05/26/16 2333  BP: 118/65 112/71  Pulse: 65 76  Resp: 18 16  Temp: 36.6 C 36.3 C    Last Pain:  Vitals:   05/26/16 2333  TempSrc: Temporal  PainSc:          Complications: No apparent anesthesia complications

## 2016-05-26 NOTE — ED Provider Notes (Addendum)
[facility] Emergency Department Provider Note  ____________________________________________   I have reviewed the triage vital signs and the nursing notes.   HISTORY  Chief Complaint Vaginal Bleeding    HPI Joyce Weber is a 36 y.o. female who is G6 P 2 with 3 prior abortionshim a history of PID in the past as well as history of endometriosis with a history also of abnormal periods presents today complaining of pelvic discomfort and vaginal bleeding for the last 2/3 days. Patient states that her last period was in mid November but it was "light" her last normal period was October 17, however prior to that September and October she had 2 periods each month, patient has a history of irregular. Making dates very difficult to establish. She states she had a negative pregnancy test in November however. She states the pain is a cramping discomfort in the lower pelvic region. She is passing clots. She does not feel lightheaded. Bleeding really started yesterday in a more significant way. Initially it felt like a period over the last day before that.     Past Medical History:  Diagnosis Date  . A-fib (HCC)   . Hypertension   . Migraines     Patient Active Problem List   Diagnosis Date Noted  . Chest pain 03/28/2013    Past Surgical History:  Procedure Laterality Date  . CARDIAC CATHETERIZATION  2009   [facility]  . DIAGNOSTIC LAPAROSCOPY  2006   ovarian cystectomy  . DIAGNOSTIC LAPAROSCOPY  2015   with fulgaration of endometrial implants  . DILATION AND CURETTAGE OF UTERUS     D&C x 2  . FOOT SURGERY Bilateral   . LAPAROSCOPY      Prior to Admission medications   Medication Sig Start Date End Date Taking? Authorizing Provider  Acetaminophen (TYLENOL PO) Take by mouth as needed.    Historical Provider, MD  cyclobenzaprine (FLEXERIL) 10 MG tablet Take 1 tablet (10 mg total) by mouth every 8 (eight) hours as needed for muscle spasms. 01/02/15   Evangeline Dakinharles M  Beers, PA-C  HYDROcodone-acetaminophen (NORCO) 5-325 MG per tablet Take 1-2 tablets by mouth every 4 (four) hours as needed for moderate pain. 01/02/15   Charmayne Sheerharles M Beers, PA-C  ibuprofen (ADVIL,MOTRIN) 800 MG tablet Take 1 tablet (800 mg total) by mouth every 8 (eight) hours as needed. 01/02/15   Evangeline Dakinharles M Beers, PA-C    Allergies Ivp dye [iodinated diagnostic agents] and Shellfish allergy  Family History  Problem Relation Age of Onset  . Hypertension Mother   . Diabetes Mother   . Hypertension Father   . Aneurysm Maternal Aunt   . Aneurysm Maternal Grandmother   . Breast cancer Maternal Grandmother   . Diabetes Paternal Grandfather   . Kidney disease Paternal Grandfather   . Hypertension Paternal Grandfather     Social History Social History  Substance Use Topics  . Smoking status: Never Smoker  . Smokeless tobacco: Not on file  . Alcohol use No     Comment: socially    Review of Systems Constitutional: No fever/chills Eyes: No visual changes. ENT: No sore throat. No stiff neck no neck pain Cardiovascular: Denies chest pain. Respiratory: Denies shortness of breath. Gastrointestinal:   no vomiting.  No diarrhea.  No constipation. Genitourinary: Negative for dysuria. Musculoskeletal: Negative lower extremity swelling Skin: Negative for rash. Neurological: Negative for severe headaches, focal weakness or numbness. 10-point ROS otherwise negative.  ____________________________________________   PHYSICAL EXAM:  VITAL SIGNS: ED Triage  Vitals  Enc Vitals Group     BP 05/26/16 1442 139/83     Pulse Rate 05/26/16 1442 91     Resp 05/26/16 1442 15     Temp 05/26/16 1442 98.1 F (36.7 C)     Temp Source 05/26/16 1442 Oral     SpO2 05/26/16 1442 100 %     Weight 05/26/16 1443 176 lb (79.8 kg)     Height 05/26/16 1443 5' 7.5" (1.715 m)     Head Circumference --      Peak Flow --      Pain Score 05/26/16 1450 10     Pain Loc --      Pain Edu? --      Excl. in GC? --      Constitutional: Alert and oriented. Well appearing and in no acute distress. Eyes: Conjunctivae are normal. PERRL. EOMI. Head: Atraumatic. Nose: No congestion/rhinnorhea. Mouth/Throat: Mucous membranes are moist.  Oropharynx non-erythematous. Neck: No stridor.   Nontender with no meningismus Cardiovascular: Normal rate, regular rhythm. Grossly normal heart sounds.  Good peripheral circulation. Respiratory: Normal respiratory effort.  No retractions. Lungs CTAB. Abdominal: Soft and Minimal suprapubic tenderness nonsurgical. No distention. No guarding no rebound Back:  There is no focal tenderness or step off.  there is no midline tenderness there are no lesions noted. there is no CVA tenderness Musculoskeletal: No lower extremity tenderness, no upper extremity tenderness. No joint effusions, no DVT signs strong distal pulses no edema Neurologic:  Normal speech and language. No gross focal neurologic deficits are appreciated.  Skin:  Skin is warm, dry and intact. No rash noted. Psychiatric: Mood and affect are normal. Speech and behavior are normal.  ____________________________________________   LABS (all labs ordered are listed, but only abnormal results are displayed)  Labs Reviewed  URINALYSIS, COMPLETE (UACMP) WITH MICROSCOPIC - Abnormal; Notable for the following:       Result Value   Color, Urine YELLOW (*)    APPearance CLEAR (*)    Hgb urine dipstick LARGE (*)    Leukocytes, UA TRACE (*)    Bacteria, UA RARE (*)    Squamous Epithelial / LPF 0-5 (*)    All other components within normal limits  HCG, QUANTITATIVE, PREGNANCY - Abnormal; Notable for the following:    hCG, Beta Chain, Quant, S 10,590 (*)    All other components within normal limits  POCT PREGNANCY, URINE - Abnormal; Notable for the following:    Preg Test, Ur POSITIVE (*)    All other components within normal limits  CHLAMYDIA/NGC RT PCR ([facility] ONLY)  WET PREP, GENITAL  CBC WITH  DIFFERENTIAL/PLATELET  POC URINE PREG, ED  ABO/RH   ____________________________________________  EKG  I personally interpreted any EKGs ordered by me or triage  ____________________________________________  RADIOLOGY  I reviewed any imaging ordered by me or triage that were performed during my shift and, if possible, patient and/or family made aware of any abnormal findings. ____________________________________________   PROCEDURES  Procedure(s) performed: None  Procedures  Critical Care performed: None  ____________________________________________   INITIAL IMPRESSION / ASSESSMENT AND PLAN / ED COURSE  Pertinent labs & imaging results that were available during my care of the patient were reviewed by me and considered in my medical decision making (see chart for details).  Ration is pregnant, Sharene Butters is 10,000 her dates are unclear, Rh is pending, we'll obtain pelvic ultrasound to rule out ectopic, she is Rh+.  ----------------------------------------- 6:11 PM on 05/26/2016 -----------------------------------------  Pelvic  exam: Female nurse chaperone present, no external lesions noted, physiologic vaginal discharge noted with no purulent discharge, no cervical motion tenderness, no adnexal tenderness or mass, there is moderate bleeding, dark, os is fingertip, positive uterine enlargement and tenderness consistent with dates and ultrasound Discussed with Dr. Chauncey Cruel of OB/GYN, who does agree with Toradol has patient has ongoing pain and he will come evaluate her.  Clinical Course    ____________________________________________   FINAL CLINICAL IMPRESSION(S) / ED DIAGNOSES  Final diagnoses:  Vaginal bleeding      This chart was dictated using voice recognition software.  Despite best efforts to proofread,  errors can occur which can change meaning.      Jeanmarie Plant, MD 05/26/16 1654    Jeanmarie Plant, MD 05/26/16 [phone]

## 2016-05-27 MED ORDER — HYDROCODONE-ACETAMINOPHEN 5-325 MG PO TABS
1.0000 | ORAL_TABLET | ORAL | Status: DC | PRN
  Administered 2016-05-27 (×2): 1 via ORAL
  Filled 2016-05-27 (×2): qty 1

## 2016-05-27 MED ORDER — METRONIDAZOLE 500 MG PO TABS: 500.0000 mg | ORAL_TABLET | Freq: Two times a day (BID) | ORAL | 0 refills | Status: AC

## 2016-05-27 MED ORDER — IBUPROFEN 600 MG PO TABS: 600.0000 mg | ORAL_TABLET | Freq: Four times a day (QID) | ORAL | 3 refills | Status: DC | PRN

## 2016-05-27 MED ORDER — HYDROCODONE-ACETAMINOPHEN 5-325 MG PO TABS
1.0000 | ORAL_TABLET | Freq: Four times a day (QID) | ORAL | 0 refills | Status: DC | PRN
Start: 2016-05-27 — End: 2018-03-04

## 2016-05-27 MED ORDER — DOXYCYCLINE HYCLATE 100 MG PO TABS
200.0000 mg | ORAL_TABLET | Freq: Once | ORAL | Status: AC
Start: 2016-05-27 — End: 2016-05-27
  Administered 2016-05-27: 200 mg via ORAL
  Filled 2016-05-27: qty 2

## 2016-05-27 MED ORDER — ONDANSETRON HCL 4 MG/2ML IJ SOLN
4.0000 mg | Freq: Four times a day (QID) | INTRAMUSCULAR | Status: DC | PRN
  Administered 2016-05-27: 4 mg via INTRAVENOUS

## 2016-05-27 MED ORDER — IBUPROFEN 600 MG PO TABS
600.0000 mg | ORAL_TABLET | Freq: Four times a day (QID) | ORAL | Status: DC | PRN
  Administered 2016-05-27: 600 mg via ORAL
  Filled 2016-05-27: qty 1

## 2016-05-27 NOTE — Progress Notes (Addendum)
Patient discharged home. Discharge instructions, prescriptions and follow up appointment given to and reviewed with patient. Patient verbalized understanding. Escorted out via wheelchair by auxiliary.  

## 2016-05-27 NOTE — Anesthesia Postprocedure Evaluation (Signed)
Anesthesia Post Note  Patient: Joyce Weber  Procedure(s) Performed: Procedure(s) (LRB): DILATATION AND EVACUATION (N/A)  Patient location during evaluation: PACU Anesthesia Type: General Level of consciousness: awake and alert Pain management: pain level controlled Vital Signs Assessment: post-procedure vital signs reviewed and stable Respiratory status: spontaneous breathing and respiratory function stable Cardiovascular status: stable Anesthetic complications: no     Last Vitals:  Vitals:   05/27/16 0018 05/27/16 0042  BP: 121/85 115/77  Pulse: 75 (!) 53  Resp: 15 18  Temp: 36.5 C 36.4 C    Last Pain:  Vitals:   05/27/16 0100  TempSrc:   PainSc: 0-No pain                 KEPHART,WILLIAM K

## 2016-05-27 NOTE — Discharge Summary (Signed)
Physician Discharge Summary  Patient ID: Joyce Weber MRN: [id number] DOB/AGE: 1980/07/10 36 y.o.  Admit date: 05/26/2016 Discharge date: 05/27/2016  Admission Diagnoses:Missed abortion   Discharge Diagnoses:  Active Problems:   Missed abortion   Discharged Condition: stable  Hospital Course: Patient presented with cramping bleeding to ED found to have 8 week missed abortion, underwent D&C uncomplicated with benign postop course  Consults: None  Significant Diagnostic Studies:  Results for orders placed or performed during the hospital encounter of 05/26/16 (from the past 24 hour(s))  Urinalysis, Complete w Microscopic     Status: Abnormal   Collection Time: 05/26/16  2:59 PM  Result Value Ref Range   Color, Urine YELLOW (A) YELLOW   APPearance CLEAR (A) CLEAR   Specific Gravity, Urine 1.009 1.005 - 1.030   pH 6.0 5.0 - 8.0   Glucose, UA NEGATIVE NEGATIVE mg/dL   Hgb urine dipstick LARGE (A) NEGATIVE   Bilirubin Urine NEGATIVE NEGATIVE   Ketones, ur NEGATIVE NEGATIVE mg/dL   Protein, ur NEGATIVE NEGATIVE mg/dL   Nitrite NEGATIVE NEGATIVE   Leukocytes, UA TRACE (A) NEGATIVE   RBC / HPF 6-30 0 - 5 RBC/hpf   WBC, UA 0-5 0 - 5 WBC/hpf   Bacteria, UA RARE (A) NONE SEEN   Squamous Epithelial / LPF 0-5 (A) NONE SEEN   Mucous PRESENT   ABO/Rh     Status: None   Collection Time: 05/26/16  2:59 PM  Result Value Ref Range   ABO/RH(D) B POS   Pregnancy, urine POC     Status: Abnormal   Collection Time: 05/26/16  3:04 PM  Result Value Ref Range   Preg Test, Ur POSITIVE (A) NEGATIVE  CBC with Differential     Status: None   Collection Time: 05/26/16  3:51 PM  Result Value Ref Range   WBC 7.7 3.6 - 11.0 K/uL   RBC 4.28 3.80 - 5.20 MIL/uL   Hemoglobin 13.9 12.0 - 16.0 g/dL   HCT 86.5 78.4 - 69.6 %   MCV 94.3 80.0 - 100.0 fL   MCH 32.4 26.0 - 34.0 pg   MCHC 34.4 32.0 - 36.0 g/dL   RDW 29.5 28.4 - 13.2 %   Platelets 223 150 - 440 K/uL   Neutrophils Relative % 61 %   Neutro  Abs 4.7 1.4 - 6.5 K/uL   Lymphocytes Relative 25 %   Lymphs Abs 1.9 1.0 - 3.6 K/uL   Monocytes Relative 11 %   Monocytes Absolute 0.8 0.2 - 0.9 K/uL   Eosinophils Relative 2 %   Eosinophils Absolute 0.2 0 - 0.7 K/uL   Basophils Relative 1 %   Basophils Absolute 0.0 0 - 0.1 K/uL  hCG, quantitative, pregnancy     Status: Abnormal   Collection Time: 05/26/16  3:51 PM  Result Value Ref Range   hCG, Beta Chain, Quant, S 10,590 (H) <5 mIU/mL  Chlamydia/NGC rt PCR     Status: None   Collection Time: 05/26/16  6:04 PM  Result Value Ref Range   Specimen source GC/Chlam ENDOCERVICAL    Chlamydia Tr NOT DETECTED NOT DETECTED   N gonorrhoeae NOT DETECTED NOT DETECTED  Wet prep, genital     Status: Abnormal   Collection Time: 05/26/16  6:04 PM  Result Value Ref Range   Yeast Wet Prep HPF POC NONE SEEN NONE SEEN   Trich, Wet Prep NONE SEEN NONE SEEN   Clue Cells Wet Prep HPF POC NONE SEEN NONE SEEN   WBC,  Wet Prep HPF POC FEW (A) NONE SEEN   Sperm NONE SEEN      Treatments: IV hydration  Discharge Exam: Blood pressure 109/73, pulse 68, temperature 98.2 F (36.8 C), temperature source Oral, resp. rate 20, height 5' 7.5" (1.715 m), weight 176 lb (79.8 kg), last menstrual period 03/09/2016, SpO2 100 %, unknown if currently breastfeeding. General appearance: alert GI: soft, non-tender; bowel sounds normal; no masses,  no organomegaly Extremities: extremities normal, atraumatic, no cyanosis or edema  Disposition: 01-Home or Self Care  Discharge Instructions    Activity as tolerated    Complete by:  As directed    Call MD for:  difficulty breathing, headache or visual disturbances    Complete by:  As directed    Call MD for:  extreme fatigue    Complete by:  As directed    Call MD for:  hives    Complete by:  As directed    Call MD for:  persistant dizziness or light-headedness    Complete by:  As directed    Call MD for:  persistant nausea and vomiting    Complete by:  As directed     Call MD for:  severe uncontrolled pain    Complete by:  As directed    Call MD for:  temperature >100.4    Complete by:  As directed    Sexual acrtivity    Complete by:  As directed    No intercourse for 6 weeks or tampons     Allergies as of 05/27/2016      Reactions   Ivp Dye [iodinated Diagnostic Agents]    Shellfish Allergy       Medication List    STOP taking these medications   cyclobenzaprine 10 MG tablet Commonly known as:  FLEXERIL   TYLENOL PO     TAKE these medications   HYDROcodone-acetaminophen 5-325 MG tablet Commonly known as:  NORCO/VICODIN Take 1 tablet by mouth every 6 (six) hours as needed. What changed:  how much to take  when to take this  reasons to take this   ibuprofen 600 MG tablet Commonly known as:  ADVIL,MOTRIN Take 1 tablet (600 mg total) by mouth every 6 (six) hours as needed. What changed:  medication strength  how much to take  when to take this   metroNIDAZOLE 500 MG tablet Commonly known as:  FLAGYL Take 1 tablet (500 mg total) by mouth 2 (two) times daily.      Follow-up Information    Nyeshia Mysliwiec, Florina OuANDREAS M, MD In 4 weeks.   Specialty:  Obstetrics and Gynecology Why:  postop visit Contact information: [address] [place] [address] [phone]           Signed: Lorrene ReidSTAEBLER, Marjorie Lussier M 05/27/2016, 7:25 AM

## 2016-05-30 LAB — SURGICAL PATHOLOGY

## 2016-12-07 ENCOUNTER — Telehealth: Payer: Self-pay

## 2016-12-07 NOTE — Telephone Encounter (Signed)
It is ok to write a note for her for specifically what happened. It is ok to mention the span of time she was under our care for that issue.  You can mention the surgery date, any appointments leading up to that surgery (even hospitalizations), and any follow up appointments she had.  Let me know if you have questions about what you can and can not say in the letter.

## 2016-12-07 NOTE — Telephone Encounter (Signed)
Pt is appealing stuff for school and needs a note saying she attended her doctor's visits, had surgery, and was released from care for that condition.  This took place in 2015.  It needs to have our letterhead and state she is cleared and able to attend classes.  [phone]

## 2016-12-07 NOTE — Telephone Encounter (Signed)
Looks like pt only came for one post op visit and did not come for the second one that you had put in her note. Please advise if it is okay to write this note for pt.

## 2016-12-07 NOTE — Telephone Encounter (Signed)
Letter written, and pt aware. Coming this PM

## 2017-06-11 ENCOUNTER — Other Ambulatory Visit: Payer: Self-pay

## 2017-06-11 ENCOUNTER — Emergency Department: Payer: Medicaid Other

## 2017-06-11 ENCOUNTER — Emergency Department
Admission: EM | Admit: 2017-06-11 | Discharge: 2017-06-12 | Disposition: A | Discharge status: Home / Self Care | Payer: Medicaid Other | Attending: Emergency Medicine | Admitting: Emergency Medicine

## 2017-06-11 ENCOUNTER — Encounter: Payer: Self-pay | Admitting: *Deleted

## 2017-06-11 DIAGNOSIS — O039 Complete or unspecified spontaneous abortion without complication: Secondary | ICD-10-CM | POA: Diagnosis not present

## 2017-06-11 DIAGNOSIS — O208 Other hemorrhage in early pregnancy: Secondary | ICD-10-CM

## 2017-06-11 DIAGNOSIS — I10 Essential (primary) hypertension: Secondary | ICD-10-CM | POA: Diagnosis not present

## 2017-06-11 DIAGNOSIS — N939 Abnormal uterine and vaginal bleeding, unspecified: Secondary | ICD-10-CM | POA: Diagnosis not present

## 2017-06-11 DIAGNOSIS — Z3A00 Weeks of gestation of pregnancy not specified: Secondary | ICD-10-CM | POA: Diagnosis not present

## 2017-06-11 DIAGNOSIS — O9989 Other specified diseases and conditions complicating pregnancy, childbirth and the puerperium: Secondary | ICD-10-CM | POA: Diagnosis present

## 2017-06-11 DIAGNOSIS — O209 Hemorrhage in early pregnancy, unspecified: Secondary | ICD-10-CM

## 2017-06-11 LAB — URINALYSIS, COMPLETE (UACMP) WITH MICROSCOPIC
Bilirubin Urine: NEGATIVE
Glucose, UA: NEGATIVE mg/dL
Ketones, ur: NEGATIVE mg/dL
Nitrite: NEGATIVE
PROTEIN: NEGATIVE mg/dL
Specific Gravity, Urine: 1.017 (ref 1.005–1.030)
pH: 6 (ref 5.0–8.0)

## 2017-06-11 LAB — POCT PREGNANCY, URINE: Preg Test, Ur: NEGATIVE

## 2017-06-11 LAB — HCG, QUANTITATIVE, PREGNANCY: HCG, BETA CHAIN, QUANT, S: 13 m[IU]/mL — AB (ref <5)

## 2017-06-11 MED ORDER — KETOROLAC TROMETHAMINE 30 MG/ML IJ SOLN: 30.0000 mg | Freq: Once | INTRAMUSCULAR | Status: DC

## 2017-06-11 MED ORDER — KETOROLAC TROMETHAMINE 30 MG/ML IJ SOLN
30.0000 mg | Freq: Once | INTRAMUSCULAR | Status: AC
  Administered 2017-06-11: 30 mg via INTRAVENOUS
  Filled 2017-06-11: qty 1

## 2017-06-11 MED ORDER — OXYCODONE-ACETAMINOPHEN 5-325 MG PO TABS
1.0000 | ORAL_TABLET | Freq: Once | ORAL | Status: AC
  Administered 2017-06-11: 1 via ORAL
  Filled 2017-06-11: qty 1

## 2017-06-11 MED ORDER — ONDANSETRON HCL 4 MG/2ML IJ SOLN
INTRAMUSCULAR | Status: AC
  Administered 2017-06-11: 4 mg via INTRAVENOUS
  Filled 2017-06-11: qty 2

## 2017-06-11 MED ORDER — ONDANSETRON HCL 4 MG/2ML IJ SOLN
4.0000 mg | Freq: Once | INTRAMUSCULAR | Status: AC
  Administered 2017-06-11: 4 mg via INTRAVENOUS

## 2017-06-11 NOTE — ED Notes (Signed)
Patient transported to Ultrasound 

## 2017-06-11 NOTE — ED Triage Notes (Signed)
Pt ambulatory to triage, crying, upset that pt waited for triage.  Charge nurse lea rn in with pt in triage.  Pt reports vaginal bleeding since yesterday, but worse tonight.  Pt has cramping and pain.   Pt reports positive home pregnancy test.  Pt has not seen a doctor.  Denies urinary sx.

## 2017-06-11 NOTE — Discharge Instructions (Addendum)
Return to the emergency department for new, worsening, or persistent bleeding, pain, vomiting, weakness, or any other new or worsening symptoms that concern you.  Follow-up with your regular OB/GYN within the next week.

## 2017-06-11 NOTE — ED Notes (Addendum)
Patient reports vaginal bleeding described as spotting beginning last night. Patient reports heavy vaginal bleeding with clumps/blood clots, and severe pelvic pressure beginning today.   Patient reports positive home pregnancy test.   Patient reports hx of endometriosis.   Patient denies vaginal discharge and urinary changes.

## 2017-06-11 NOTE — ED Provider Notes (Signed)
[facility] Emergency Department Provider Note ____________________________________________   First MD Initiated Contact with Patient 06/11/17 2101     (approximate)  I have reviewed the triage vital signs and the nursing notes.   HISTORY  Chief Complaint Vaginal Bleeding    HPI Joyce Weber is a 37 y.o. female G6 P2 with LMP around December 16 presents with vaginal bleeding for approximately 1 day, initially light spotting, but now more heavy bleeding the last several hours, with the patient changing 1 pad per hour.  She reports crampy lower abdominal pain worse on the left side.  She denies vomiting, fevers, urinary symptoms, or vaginal discharge.  She took a home pregnancy test 1 week ago which was positive, and has not sought medical care for this pregnancy previously.  Past Medical History:  Diagnosis Date  . A-fib (HCC)   . Hypertension   . Migraines     Patient Active Problem List   Diagnosis Date Noted  . Missed abortion 05/26/2016  . Chest pain 03/28/2013    Past Surgical History:  Procedure Laterality Date  . CARDIAC CATHETERIZATION  2009   [facility]  . DIAGNOSTIC LAPAROSCOPY  2006   ovarian cystectomy  . DIAGNOSTIC LAPAROSCOPY  2015   with fulgaration of endometrial implants  . DILATION AND CURETTAGE OF UTERUS     D&C x 2  . DILATION AND EVACUATION N/A 05/26/2016   Procedure: DILATATION AND EVACUATION;  Surgeon: Vena Austria, MD;  Location: [facility] ORS;  Service: Gynecology;  Laterality: N/A;  . FOOT SURGERY Bilateral   . LAPAROSCOPY      Prior to Admission medications   Medication Sig Start Date End Date Taking? Authorizing Provider  HYDROcodone-acetaminophen (NORCO/VICODIN) 5-325 MG tablet Take 1 tablet by mouth every 6 (six) hours as needed. 05/27/16   Vena Austria, MD  ibuprofen (ADVIL,MOTRIN) 600 MG tablet Take 1 tablet (600 mg total) by mouth every 6 (six) hours as needed. 05/27/16   Vena Austria, MD    Allergies Ivp  dye [iodinated diagnostic agents] and Shellfish allergy  Family History  Problem Relation Age of Onset  . Hypertension Mother   . Diabetes Mother   . Hypertension Father   . Aneurysm Maternal Aunt   . Aneurysm Maternal Grandmother   . Breast cancer Maternal Grandmother   . Diabetes Paternal Grandfather   . Kidney disease Paternal Grandfather   . Hypertension Paternal Grandfather     Social History Social History   Tobacco Use  . Smoking status: Never Smoker  . Smokeless tobacco: Never Used  Substance Use Topics  . Alcohol use: No    Comment: socially  . Drug use: No    Review of Systems  Constitutional: No fever. Eyes: No redness. ENT: No sore throat. Cardiovascular: Denies chest pain. Respiratory: Denies shortness of breath. Gastrointestinal: No nausea, no vomiting.    Genitourinary:  Positive for vaginal bleeding. Musculoskeletal: Positive for back pain. Skin: Negative for rash. Neurological: Negative for headache.   ____________________________________________   PHYSICAL EXAM:  VITAL SIGNS: ED Triage Vitals  Enc Vitals Group     BP 06/11/17 2044 131/86     Pulse Rate 06/11/17 2044 75     Resp 06/11/17 2044 20     Temp 06/11/17 2044 98.7 F (37.1 C)     Temp Source 06/11/17 2044 Oral     SpO2 06/11/17 2044 99 %     Weight 06/11/17 2045 180 lb (81.6 kg)     Height 06/11/17 2045 5'  7" (1.702 m)     Head Circumference --      Peak Flow --      Pain Score 06/11/17 2043 9     Pain Loc --      Pain Edu? --      Excl. in GC? --     Constitutional: Alert and oriented. Well appearing and in no acute distress. Eyes: Conjunctivae are normal.  Head: Atraumatic. Nose: No congestion/rhinnorhea. Mouth/Throat: Mucous membranes are moist.   Neck: Normal range of motion.  Cardiovascular:  Good peripheral circulation. Respiratory: Normal respiratory effort.  Gastrointestinal: Soft and nontender.  Genitourinary: No flank tenderness. Musculoskeletal:  Extremities warm and well perfused.  Neurologic:  Normal speech and language. No gross focal neurologic deficits are appreciated.  Skin:  Skin is warm and dry. No rash noted. Psychiatric: Mood and affect are normal. Speech and behavior are normal.  ____________________________________________   LABS (all labs ordered are listed, but only abnormal results are displayed)  Labs Reviewed  URINALYSIS, COMPLETE (UACMP) WITH MICROSCOPIC - Abnormal; Notable for the following components:      Result Value   Color, Urine YELLOW (*)    APPearance HAZY (*)    Hgb urine dipstick LARGE (*)    Leukocytes, UA TRACE (*)    Bacteria, UA RARE (*)    Squamous Epithelial / LPF 0-5 (*)    All other components within normal limits  HCG, QUANTITATIVE, PREGNANCY - Abnormal; Notable for the following components:   hCG, Beta Chain, Quant, S 13 (*)    All other components within normal limits  POCT PREGNANCY, URINE  ABO/RH   ____________________________________________  EKG   ____________________________________________  RADIOLOGY  Pelvic US: pending  ____________________________________________   PROCEDURES  Procedure(s) performed: No    Critical Care performed: No ____________________________________________   INITIAL IMPRESSION / ASSESSMENT AND PLAN / ED COURSE  Pertinent labs & imaging results that were available during my care of the patient were reviewed by me and considered in my medical decision making (see chart for details).  37 year old female G6 P2 with LMP approximately 4 weeks ago presents with vaginal bleeding over the last day, with peak bleeding this afternoon requiring 1 pad per hour.  Patient also reports crampy lower abdominal pain.  Past medical records reviewed in Epic and are noncontributory.  On exam, the patient is comfortable appearing, the vital signs are normal, and the abdomen is soft with no significant focal tenderness.  Based on discussion with the  patient, she would prefer to defer the pelvic exam at this time.  Presentation is most consistent with spontaneous miscarriage, but differential also includes with incomplete miscarriage or less likely ectopic pregnancy.  Patient's urine hCG is negative currently.  We will give analgesia, obtain serum hCG, and pelvic ultrasound.    ----------------------------------------- 11:53 PM on 06/11/2017 -----------------------------------------  Patient's ultrasound is in progress.  I am signing the patient out to the oncoming physician Dr. Zenda AlpersWebster.  Anticipate discharge home if ultrasound findings are consistent with miscarriage.  ____________________________________________   FINAL CLINICAL IMPRESSION(S) / ED DIAGNOSES  Final diagnoses:  Miscarriage      NEW MEDICATIONS STARTED DURING THIS VISIT:  New Prescriptions   No medications on file     Note:  This document was prepared using Dragon voice recognition software and may include unintentional dictation errors.    Dionne BucySiadecki, Jamirra Curnow, MD 06/11/17 [phone]

## 2017-06-11 NOTE — ED Notes (Signed)
poct pregnancy Negative 

## 2017-06-12 NOTE — ED Notes (Signed)
Reviewed discharge instructions and follow-up care with patient. Patient verbalized understanding of all information reviewed. Patient stable, with no distress noted at this time.    

## 2017-06-12 NOTE — ED Provider Notes (Signed)
-----------------------------------------   12:33 AM on 06/12/2017 -----------------------------------------   Blood pressure 118/65, pulse 70, temperature 98.9 F (37.2 C), temperature source Oral, resp. rate 17, height 5\' 7"  (1.702 m), weight 81.6 kg (180 lb), last menstrual period 05/07/2017, SpO2 100 %, unknown if currently breastfeeding.  Assuming care from Dr. Marisa SeverinSiadecki.  In short, Joyce Weber is a 37 y.o. female with a chief complaint of Vaginal Bleeding .  Refer to the original H&P for additional details.  The current plan of care is to follow up the results of the ultrasound.  The patient's ultrasound shows no intrauterine pregnancy visible, consistent with pregnancy of unknown location.     It appears as though the patient has had a miscarriage.  She is Rh+.  She will be discharged home to follow-up.   [name], Allison P, MD 06/12/17 [phone]

## 2017-06-12 NOTE — ED Notes (Signed)
ED Provider at bedside. 

## 2017-06-13 ENCOUNTER — Encounter: Payer: Self-pay | Admitting: Obstetrics and Gynecology

## 2017-06-13 ENCOUNTER — Ambulatory Visit (INDEPENDENT_AMBULATORY_CARE_PROVIDER_SITE_OTHER): Payer: Medicaid Other | Admitting: Obstetrics and Gynecology

## 2017-06-13 VITALS — BP 118/76 | HR 72 | Ht 67.0 in | Wt 188.5 lb

## 2017-06-13 DIAGNOSIS — R42 Dizziness and giddiness: Secondary | ICD-10-CM | POA: Diagnosis not present

## 2017-06-13 DIAGNOSIS — Z8759 Personal history of other complications of pregnancy, childbirth and the puerperium: Secondary | ICD-10-CM

## 2017-06-13 DIAGNOSIS — O039 Complete or unspecified spontaneous abortion without complication: Secondary | ICD-10-CM

## 2017-06-13 DIAGNOSIS — N96 Recurrent pregnancy loss: Secondary | ICD-10-CM | POA: Diagnosis not present

## 2017-06-13 DIAGNOSIS — R102 Pelvic and perineal pain: Secondary | ICD-10-CM | POA: Diagnosis not present

## 2017-06-13 MED ORDER — MISOPROSTOL 200 MCG PO TABS: 600.0000 ug | ORAL_TABLET | Freq: Once | ORAL | 1 refills | Status: DC

## 2017-06-13 MED ORDER — ONDANSETRON HCL 4 MG PO TABS: 4.0000 mg | ORAL_TABLET | Freq: Three times a day (TID) | ORAL | 0 refills | Status: DC | PRN

## 2017-06-13 MED ORDER — ACETAMINOPHEN-CODEINE #3 300-30 MG PO TABS: 1.0000 | ORAL_TABLET | Freq: Four times a day (QID) | ORAL | 0 refills | Status: DC | PRN
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
## 2017-06-14 ENCOUNTER — Encounter: Payer: Self-pay | Admitting: Obstetrics and Gynecology

## 2017-06-14 DIAGNOSIS — I4891 Unspecified atrial fibrillation: Secondary | ICD-10-CM | POA: Insufficient documentation

## 2017-06-14 DIAGNOSIS — I10 Essential (primary) hypertension: Secondary | ICD-10-CM | POA: Insufficient documentation

## 2017-06-20 ENCOUNTER — Encounter: Payer: Self-pay | Admitting: Obstetrics and Gynecology

## 2017-07-05 ENCOUNTER — Encounter: Payer: Self-pay | Admitting: Obstetrics and Gynecology

## 2017-07-05 ENCOUNTER — Ambulatory Visit (INDEPENDENT_AMBULATORY_CARE_PROVIDER_SITE_OTHER): Payer: Medicaid Other | Admitting: Obstetrics and Gynecology

## 2017-07-05 VITALS — BP 108/77 | HR 76 | Ht 67.0 in | Wt 184.8 lb

## 2017-07-05 DIAGNOSIS — B3731 Acute candidiasis of vulva and vagina: Secondary | ICD-10-CM

## 2017-07-05 DIAGNOSIS — I4891 Unspecified atrial fibrillation: Secondary | ICD-10-CM | POA: Diagnosis not present

## 2017-07-05 DIAGNOSIS — Z9889 Other specified postprocedural states: Secondary | ICD-10-CM | POA: Diagnosis not present

## 2017-07-05 DIAGNOSIS — O039 Complete or unspecified spontaneous abortion without complication: Secondary | ICD-10-CM | POA: Diagnosis not present

## 2017-07-05 DIAGNOSIS — B373 Candidiasis of vulva and vagina: Secondary | ICD-10-CM | POA: Diagnosis not present

## 2017-07-05 DIAGNOSIS — N96 Recurrent pregnancy loss: Secondary | ICD-10-CM

## 2017-07-05 DIAGNOSIS — I10 Essential (primary) hypertension: Secondary | ICD-10-CM

## 2017-07-05 MED ORDER — FLUCONAZOLE 150 MG PO TABS: 150.0000 mg | ORAL_TABLET | Freq: Once | ORAL | 3 refills | Status: AC

## 2017-07-05 NOTE — Progress Notes (Signed)
Pt is doing well.

## 2017-07-05 NOTE — Progress Notes (Signed)
    GYNECOLOGY PROGRESS NOTE  Subjective:    Patient ID: Joyce Weber, female    DOB: 06/18/1980, 37 y.o.   MRN: [id number]  HPI  Patient is a 37 y.o. [id number] female who presents for further f/u after miscarriage and discussion of fertility plans.  She was treated with a dose of Cytotec for continuous heavy bleeding several days after miscarriage ~ 3 weeks ago.  Patient notes that the bleeding stopped 1-2 days after treatment.   She states that she is still uncertain regarding her plans for future fertility.  She is still debating as to whether or not she desires to try again, or if she would like to proceed with permanent sterilization. Notes that although she is physically healing, she is still trying to heal emotionally. Inquires again if there are any possible reasons that she may have miscarried.   Lastly, she reports complaints of vaginal discharge for several days, becoming itchy starting today. Denies vaginal odor. Notes discharge is thick, white. Has not attempted to treat with any OTC meds.   The following portions of the patient's history were reviewed and updated as appropriate: allergies, current medications, past family history, past medical history, past social history, past surgical history and problem list.  Review of Systems Pertinent items noted in HPI and remainder of comprehensive ROS otherwise negative.   Objective:   Blood pressure 108/77, pulse 76, height 5\' 7"  (1.702 m), weight 184 lb 12.8 oz (83.8 kg), unknown if currently breastfeeding. General appearance: alert, no distress and appears tearful  Abdomen: soft, non-tender; bowel sounds normal; no masses,  no organomegaly Pelvic: external genitalia normal, rectovaginal septum normal.  Vagina with moderate amount of white discharge, some clumpy discharge adherent to vaginal wall, no odor.  Cervix normal appearing, no lesions and no motion tenderness.  Uterus mobile, nontender, normal shape and size.  Adnexae  non-palpable, nontender bilaterally.    Labs:  Microscopic wet-mount exam shows normal epithelial cells, few clew cells.  No trichomonads. KOH done, few budding yeast.   Assessment:   S/p miscarriage History of 2 prior miscarriages (recurrent) History of pregnancy termination Multiple comorbidities Yeast vaginitis  Plan:   -S/p miscarriage, no further bleeding. Has not yet resumed menses. Advised that menses usually returns within 2-6 weeks after miscarriage.  Advised that if no cycle occurs, she may require a course of OCPs to jumpstart cycles. Also given handout on support groups for miscarriages.  - Reiterated with patient that it is sometimes difficult to assess the level of damage caused by D&C's from terminations on the uterine cavity.  Advised that it is possible to develop scar tissue (synychae).  Also discussed that if she were truly desiring to conceive again, would recommend taking a prenatal vitamin and a daily baby aspirin. Can also consider a Hysterosonogram to assess uterine cavity and rule out structural causes.  If patient is no longer considering child-bearing, again can discuss contraception methods including permanent sterilization.   - Discussed that due to multiple comorbidities (A-fib, HTN - although not on meds), that she would be considered a high risk pregnancy, and would have to seriously consider risks of becoming pregnant.  - Will treat yeast infection with Diflucan.   Patient to f/o once decision has been made regarding fertility desires.   A total of 25 minutes were spent face-to-face with the patient during the encounter with greater than 50% dealing with counseling and coordination of care.

## 2017-12-08 ENCOUNTER — Telehealth: Payer: Self-pay

## 2017-12-08 NOTE — Telephone Encounter (Signed)
Pryor Montesakia w/[facility] calling to see if pt had a laparoscopic procedure done [phone]

## 2017-12-11 NOTE — Telephone Encounter (Signed)
Spoke w/Nakia. She states she has faxed of Release of Info from patient. All she received was record of Abortion. She needs any records of Laparascopic procedures. Notified will send to [employer]. Who handles HIM but has been out of the office 7/18-7/22.

## 2018-03-04 ENCOUNTER — Other Ambulatory Visit: Payer: Self-pay

## 2018-03-04 ENCOUNTER — Emergency Department
Admission: EM | Admit: 2018-03-04 | Discharge: 2018-03-04 | Disposition: A | Discharge status: Home / Self Care | Payer: Worker's Compensation | Attending: Emergency Medicine | Admitting: Emergency Medicine

## 2018-03-04 ENCOUNTER — Encounter: Payer: Self-pay | Admitting: Emergency Medicine

## 2018-03-04 ENCOUNTER — Emergency Department: Payer: Worker's Compensation

## 2018-03-04 DIAGNOSIS — Y990 Civilian activity done for income or pay: Secondary | ICD-10-CM | POA: Diagnosis not present

## 2018-03-04 DIAGNOSIS — Y929 Unspecified place or not applicable: Secondary | ICD-10-CM | POA: Insufficient documentation

## 2018-03-04 DIAGNOSIS — Y9389 Activity, other specified: Secondary | ICD-10-CM | POA: Diagnosis not present

## 2018-03-04 DIAGNOSIS — S9031XA Contusion of right foot, initial encounter: Secondary | ICD-10-CM | POA: Diagnosis not present

## 2018-03-04 DIAGNOSIS — S39012A Strain of muscle, fascia and tendon of lower back, initial encounter: Secondary | ICD-10-CM | POA: Insufficient documentation

## 2018-03-04 DIAGNOSIS — W228XXA Striking against or struck by other objects, initial encounter: Secondary | ICD-10-CM | POA: Diagnosis not present

## 2018-03-04 DIAGNOSIS — S99921A Unspecified injury of right foot, initial encounter: Secondary | ICD-10-CM | POA: Diagnosis present

## 2018-03-04 DIAGNOSIS — I10 Essential (primary) hypertension: Secondary | ICD-10-CM | POA: Diagnosis not present

## 2018-03-04 MED ORDER — BACLOFEN 10 MG PO TABS: 10.0000 mg | ORAL_TABLET | Freq: Three times a day (TID) | ORAL | 1 refills | Status: AC

## 2018-03-04 NOTE — ED Notes (Addendum)
Tom RN was sent to discharge pt - pt was asking Estate manager/land agent the number for patient relations - he did not know the number so this nurse attempted to give him the number - the pt became extrememely agitated and stated that this was a conversation between her and Gershon Mussel and that this nurse needed to "stop talking" - she stated this nurse "was the rudest nurse she had ever met and Tom did not need any help from someone like you" - pt states that she works at [employer] and that people don't get treated like this there and that she will never come back to [facility] - this nurse continued and gave [employer] the number for pt relations over the sound of pt raising voice at this nurse

## 2018-03-04 NOTE — ED Triage Notes (Signed)
Pt reports chairs fell on right foot at work. This is worker comp, [employer].  Ambulatory to triage. VSS

## 2018-03-04 NOTE — Discharge Instructions (Signed)
Follow-up with [facility] clinic acute care or a Worker's Comp. provider of your insurance carrier choice.  Take ibuprofen and Tylenol for pain as needed.  Wear the postop shoe for comfort.  Elevate and ice the foot.  Apply ice to the lower back.  Return emergency department worsening.

## 2018-03-04 NOTE — ED Notes (Signed)
Pt reports that while at work some chairs fell on her right foot - this occurred last pm

## 2018-03-04 NOTE — ED Notes (Signed)
This nurse went to discharge this patient with the understanding that she had a difficult interaction with Rosey Bath RN who was trying to address her request to speak to patient relations. Upon entering the room I made her aware that I was there to discuss her discharge and instructions for follow on care. The patient stated that she was very ready to go home and was very upset that she had been waiting for the discharge for over an hour. She then stated that she was very unhappy with the attitude of the previous nurse and that she would never have been treated this way at [facility]. I stated that I was sorry that she felt she had such an unpleasant experience. She asked for the number for patient relations which I provided at the top of her discharge instructions. At that point, she raised her voice as Lona Kettle passed by the door, yelling into the passageway that she was the rudest nurse she had ever encountered. At that point, she signed the patient discharge and left through the lobby.

## 2018-03-04 NOTE — ED Notes (Signed)
Pt very argumentative with staff and making motions with her hands and closing her fingers in this nurses face - demanding to see patient relations - stating that she told triage, D[facility] Pikes [facility], and this nurse that she was workers compensation and that there was no reason that she should still be here doing a drug screen - called charge and notified her that pt wanted pt relations was advised to have pt call back tomorrow - pt informed and requested phone number - this nurse advised pt she would get her the number and that I needed something to write it on - advised pt that I was not aware that she was a workers comp claim but that the ED Tech was collecting everything she needed in order to file the drug screen - this pt became more agitated about having to "waste my time here" - this nurse went to leave room to obtain paper to write the number on and pt yelled at nurse "I was talking to you" - advised pt that she could call the main number to the hospital and request pt relations - she yelled at this nurse and stated "you will be at the top of the list" - this nurse gave pt her name and title
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
## 2021-08-14 ENCOUNTER — Emergency Department: Payer: BC Managed Care – PPO

## 2021-08-14 ENCOUNTER — Emergency Department
Admission: EM | Admit: 2021-08-14 | Discharge: 2021-08-14 | Disposition: A | Discharge status: Home / Self Care | Payer: BC Managed Care – PPO | Attending: Emergency Medicine | Admitting: Emergency Medicine

## 2021-08-14 DIAGNOSIS — I10 Essential (primary) hypertension: Secondary | ICD-10-CM | POA: Insufficient documentation

## 2021-08-14 DIAGNOSIS — R0789 Other chest pain: Secondary | ICD-10-CM | POA: Insufficient documentation

## 2021-08-14 DIAGNOSIS — R079 Chest pain, unspecified: Secondary | ICD-10-CM

## 2021-08-14 LAB — POC URINE PREG, ED: Preg Test, Ur: NEGATIVE

## 2021-08-14 LAB — CBC
HCT: 41.5 % (ref 36.0–46.0)
Hemoglobin: 13.5 g/dL (ref 12.0–15.0)
MCH: 31 pg (ref 26.0–34.0)
MCHC: 32.5 g/dL (ref 30.0–36.0)
MCV: 95.2 fL (ref 80.0–100.0)
Platelets: 256 10*3/uL (ref 150–400)
RBC: 4.36 MIL/uL (ref 3.87–5.11)
RDW: 12.2 % (ref 11.5–15.5)
WBC: 5.3 10*3/uL (ref 4.0–10.5)
nRBC: 0 % (ref 0.0–0.2)

## 2021-08-14 LAB — BASIC METABOLIC PANEL
Anion gap: 6 (ref 5–15)
BUN: 8 mg/dL (ref 6–20)
CO2: 26 mmol/L (ref 22–32)
Calcium: 8.9 mg/dL (ref 8.9–10.3)
Chloride: 108 mmol/L (ref 98–111)
Creatinine, Ser: 0.7 mg/dL (ref 0.44–1.00)
GFR, Estimated: >60 mL/min (ref >60)
Glucose, Bld: 99 mg/dL (ref 70–99)
Potassium: 3.6 mmol/L (ref 3.5–5.1)
Sodium: 140 mmol/L (ref 135–145)

## 2021-08-14 LAB — BRAIN NATRIURETIC PEPTIDE: B Natriuretic Peptide: 31.2 pg/mL (ref 0.0–100.0)

## 2021-08-14 LAB — TROPONIN I (HIGH SENSITIVITY)
Troponin I (High Sensitivity): 4 ng/L (ref <18)
Troponin I (High Sensitivity): 5 ng/L (ref <18)

## 2021-08-14 MED ORDER — AMLODIPINE BESYLATE 5 MG PO TABS: 5.0000 mg | ORAL_TABLET | Freq: Every day | ORAL | 0 refills | Status: AC

## 2021-08-14 MED ORDER — AMLODIPINE BESYLATE 5 MG PO TABS
5.0000 mg | ORAL_TABLET | Freq: Once | ORAL | Status: AC
  Administered 2021-08-14: 5 mg via ORAL
  Filled 2021-08-14: qty 1

## 2021-08-14 NOTE — ED Notes (Signed)
Pt. Up to toilet in room, gait steady, NAD, pt. Denies further need at this time. ?

## 2021-08-14 NOTE — ED Provider Notes (Signed)
? ?[facility] ?Provider Note ? ? ? Event Date/Time  ? First MD Initiated Contact with Patient 08/14/21 0708   ?  (approximate) ? ? ?History  ? ?Chest Pain, Shortness of Breath, and Abdominal Pain ? ? ?HPI ? ?Joyce Weber is a 41 y.o. female who in accordance to her clinic note from January 30 of this year history of 1 time episode of a-fib, PVC, and hypertension   ? ?She has been experiencing a feeling of chest pressure for the last day.  Comes and goes.  Seems to be worsened when she moves about. ? ?Is not sharp, is not associated any shortness of breath.  She reports she has had the symptoms off and on for several years has a cardiologist and has had a monitor recently for A-fib.  She follows with Dr. Kathi Ludwig ? ?No history of blood clots.  No unilateral leg swelling.  Did have leg swelling after surgery in December but that went away and she had ultrasounds which did not show blood clots ? ?She no longer takes any medications and is not on any birth control.  Does not believe she is pregnant today ? ?The chest pain does not radiate or move.  Its not a ripping or tearing type of pain.  She reports is a pressure feeling in her central chest. ? ?Used to take a blood pressure medication but that was discontinued ? ?No headache or blurred vision ? ?Physical Exam  ? ?Triage Vital Signs: ?ED Triage Vitals  ?Enc Vitals Group  ?   BP 08/14/21 0643 (!) 155/127  ?   Pulse Rate 08/14/21 0643 71  ?   Resp 08/14/21 0643 18  ?   Temp 08/14/21 0643 98.6 ?F (37 ?C)  ?   Temp Source 08/14/21 0643 Oral  ?   SpO2 08/14/21 0643 96 %  ?   Weight --   ?   Height --   ?   Head Circumference --   ?   Peak Flow --   ?   Pain Score 08/14/21 0644 8  ?   Pain Loc --   ?   Pain Edu? --   ?   Excl. in GC? --   ? ? ?Most recent vital signs: ?Vitals:  ? 08/14/21 1045 08/14/21 1100  ?BP:  (!) 138/92  ?Pulse: 71 69  ?Resp: (!) 21 12  ?Temp:    ?SpO2: 100% 100%  ? ? ? ?General: Awake, no distress.  Very pleasant ?CV:  Good  peripheral perfusion.  Normal heart tones no murmurs or gallops ?Resp:  Normal effort.  Clear lungs bilaterally normal work of breathing ?Abd:  No distention.  Soft nontender nondistended.  Does not appear gravid ?Other:  No lower extremity edema or swelling.  No calf tenderness. ?No JVD. ? ? ?ED Results / Procedures / Treatments  ? ?Labs ?(all labs ordered are listed, but only abnormal results are displayed) ?Labs Reviewed  ?BASIC METABOLIC PANEL  ?CBC  ?BRAIN NATRIURETIC PEPTIDE  ?POC URINE PREG, ED  ?TROPONIN I (HIGH SENSITIVITY)  ?TROPONIN I (HIGH SENSITIVITY)  ? ? ? ?EKG ? ?Reviewed inter by me at 650 ?Heart rate 70 ?QRS 89 QTc 400 ?Normal sinus rhythm no evidence of ischemia or ectopy ? ? ?RADIOLOGY ? ?Chest x-ray is personally viewed and interpreted by me as normal ? ? ?PROCEDURES: ? ?Critical Care performed: No ? ?Procedures ? ? ?MEDICATIONS ORDERED IN ED: ?Medications  ?amLODipine (NORVASC) tablet 5 mg (5 mg  Oral Given 08/14/21 0802)  ? ? ? ?IMPRESSION / MDM / ASSESSMENT AND PLAN / ED COURSE  ?I reviewed the triage vital signs and the nursing notes. ?             ?               ? ?Differential diagnosis includes, but is not limited to, chest discomfort etiology somewhat unclear.  Does not sound typical of ACS.  Is nonradiating its intermittent pressure seems worse with movement.  Also noted to be notably hypertensive, and had previously discontinued antihypertensive. ? ?No pleuritic symptoms no hypoxia no tachycardia no signs or symptoms of be suggestive of PE.  No ripping tearing or moving pain that would suggest dissection.  Blood pressures similar when checked in both arms by myself ? ? ? ? ?The patient is on the cardiac monitor to evaluate for evidence of arrhythmia and/or significant heart rate changes. ? ?Personally interpreted the patient's labs as normal metabolic panel normal first troponin normal negative pregnancy test ? ? ? ?Clinical Course as of 08/14/21 1111  ?Sat Aug 14, 2021  ?0722  ?    ?Pulmonary Embolism Rule-out Criteria (PERC rule) ?  ??                     If YES to ANY of the following, the PERC rule is not satisfied and cannot be used to rule out PE in this patient (consider d-dimer or imaging depending on pre-test probability). ??                     If NO to ALL of the following, AND the clinician's pre-test probability is <15%, the [facility] rule is satisfied and there is no need for further workup (including no need to obtain a d-dimer) as the post-test probability of pulmonary embolism is <2%. ??                     Mnemonic is HAD CLOTS ?  ?H - hormone use (exogenous estrogen)      No. ?A - age > 50                                                 No. ?D - DVT/PE history                                      No. ?  ?C - coughing blood (hemoptysis)                 No. ?L - leg swelling, unilateral                             No. ?O - O2 Sat on Room Air < 95%                  No. ?T - tachycardia (HR ? 100)                         No. ?S - surgery or trauma, recent  No. ?  ?Based on my evaluation of the patient, including application of this decision instrument, further testing to evaluate for pulmonary embolism is not indicated at this time.  ? ?Had surgery Dec 2022. Had bilateral leg swelling 2 months ago, resolved. Had negative DVT studies. Not taking ANY medications or hormomes now.  [MQ]  ?[id number] Recent echo: Summary  ?  1. The left ventricle is normal in size with normal wall thickness.  ?  2. The left ventricular systolic function is normal, LVEF is visually  ?estimated at > 55%.  ?  3. The left atrium is mildly dilated in size.  ?  4. The right ventricle is normal in size, with normal systolic function.  ? [MQ]  ?[id number] Will give Norvasc here, discussed risk benefits and potential side effects with the patient prior.  Urine pregnancy test negative.  Patient understands that if she believes she is pregnant or becomes pregnant while taking this medication she needs to  contact her physician right away.  She is not intending to become pregnant.  She agrees that she will be able to follow-[facility] closely [facility].  [MQ]  ?  ?Clinical Course User Index ?[MQ] Sharyn CreamerQuale, Susen Haskew, MD  ? ?----------------------------------------- ?9:08 AM on 08/14/2021 ?----------------------------------------- ?Vitals:  ? 08/14/21 1045 08/14/21 1100  ?BP:  (!) 138/92  ?Pulse: 71 69  ?Resp: (!) 21 12  ?Temp:    ?SpO2: 100% 100%  ? ? ?Blood pressure is normalized, patient reports symptoms have gone away.  Question possible mild hypertensive urgency.  We will repeat second troponin if this normal anticipate discharge with blood pressure medication and recommendation to follow-up with Dr. Kathi LudwigSyed and PCP ? ?----------------------------------------- ?11:11 AM on 08/14/2021 ?----------------------------------------- ?Second troponin interpreted as normal.  Patient asymptomatic.  Will prescribe Norvasc, she advises she has a cardiology follow-up coming early next month and will also follow-up with PCP. ? ?Return precautions and treatment recommendations and follow-up discussed with the patient who is agreeable with the plan. ? ? ?FINAL CLINICAL IMPRESSION(S) / ED DIAGNOSES  ? ?Final diagnoses:  ?Chest pain with low risk of acute coronary syndrome  ?Hypertension, unspecified type  ? ? ? ?Rx / DC Orders  ? ?ED Discharge Orders   ? ?      Ordered  ?  amLODipine (NORVASC) 5 MG tablet  Daily       ? 08/14/21 1110  ? ?  ?  ? ?  ? ? ? ?Note:  This document was prepared using Dragon voice recognition software and may include unintentional dictation errors. ?  Sharyn Creamer?Sadiq Mccauley, MD ?08/14/21 1111 ? ?

## 2021-08-14 NOTE — ED Notes (Signed)
XR at bedside

## 2021-08-14 NOTE — ED Triage Notes (Signed)
This morning the pt states she woke up with chest pain, SOB and abdominal pain. Pt has Hx of A-fib and PVCs.  ?

## 2021-08-14 NOTE — ED Notes (Signed)
Pt. Up to bedside toilet, denies chest pressure, dizziness etc. Gait steady, NAD. ?

## 2021-11-30 ENCOUNTER — Other Ambulatory Visit: Payer: Self-pay

## 2021-11-30 ENCOUNTER — Encounter: Payer: Self-pay | Admitting: *Deleted

## 2021-11-30 DIAGNOSIS — S61217A Laceration without foreign body of left little finger without damage to nail, initial encounter: Secondary | ICD-10-CM | POA: Insufficient documentation

## 2021-11-30 DIAGNOSIS — Z23 Encounter for immunization: Secondary | ICD-10-CM | POA: Diagnosis not present

## 2021-11-30 DIAGNOSIS — W260XXA Contact with knife, initial encounter: Secondary | ICD-10-CM | POA: Insufficient documentation

## 2021-11-30 DIAGNOSIS — Y92000 Kitchen of unspecified non-institutional (private) residence as  the place of occurrence of the external cause: Secondary | ICD-10-CM | POA: Insufficient documentation

## 2021-11-30 DIAGNOSIS — S6992XA Unspecified injury of left wrist, hand and finger(s), initial encounter: Secondary | ICD-10-CM | POA: Diagnosis present

## 2021-11-30 NOTE — ED Triage Notes (Signed)
Pt has laceration to left 5th finger.  Pt cut with a knife while slicing watermelon.  Bleeding controlled.  Pt alert.

## 2021-12-01 ENCOUNTER — Emergency Department
Admission: EM | Admit: 2021-12-01 | Discharge: 2021-12-01 | Disposition: A | Discharge status: Home / Self Care | Payer: BC Managed Care – PPO | Attending: Emergency Medicine | Admitting: Emergency Medicine

## 2021-12-01 DIAGNOSIS — S61217A Laceration without foreign body of left little finger without damage to nail, initial encounter: Secondary | ICD-10-CM

## 2021-12-01 MED ORDER — LIDOCAINE HCL (PF) 1 % IJ SOLN
5.0000 mL | Freq: Once | INTRAMUSCULAR | Status: AC
  Administered 2021-12-01: 5 mL
  Filled 2021-12-01: qty 5

## 2021-12-01 MED ORDER — TETANUS-DIPHTH-ACELL PERTUSSIS 5-2.5-18.5 LF-MCG/0.5 IM SUSY
0.5000 mL | PREFILLED_SYRINGE | Freq: Once | INTRAMUSCULAR | Status: AC
  Administered 2021-12-01: 0.5 mL via INTRAMUSCULAR
  Filled 2021-12-01: qty 0.5

## 2021-12-01 NOTE — ED Provider Notes (Signed)
[facility] Provider Note    Event Date/Time   First MD Initiated Contact with Patient 12/01/21 [id number]     (approximate)   History   Chief Complaint Laceration   HPI  Joyce Weber is a 42 y.o. female with no significant past medical history who presents to the ED complaining of laceration.  Patient reports that approximately 1 hour prior to arrival she was cutting watermelon with a knife in her kitchen when she slipped and cut her left pinky finger.  She reports a small laceration over the ventral surface of the pinky, was able to control bleeding at home with pressure.  She states her tetanus was last updated about 7 years ago.     Physical Exam   Triage Vital Signs: ED Triage Vitals  Enc Vitals Group     BP 11/30/21 2359 (!) 151/15     Pulse Rate 11/30/21 2359 71     Resp 11/30/21 2359 18     Temp 11/30/21 2359 98.8 F (37.1 C)     Temp Source 11/30/21 2359 Oral     SpO2 11/30/21 2359 98 %     Weight 11/30/21 2358 205 lb (93 kg)     Height 11/30/21 2358 5\' 7"  (1.702 m)     Head Circumference --      Peak Flow --      Pain Score 11/30/21 2357 8     Pain Loc --      Pain Edu? --      Excl. in GC? --     Most recent vital signs: Vitals:   11/30/21 2359 12/01/21 0307  BP: (!) 151/15 (!) 139/98  Pulse: 71 66  Resp: 18 18  Temp: 98.8 F (37.1 C) 98.8 F (37.1 C)  SpO2: 98% 98%    Constitutional: Alert and oriented. Eyes: Conjunctivae are normal. Head: Atraumatic. Nose: No congestion/rhinnorhea. Mouth/Throat: Mucous membranes are moist.  Cardiovascular: Normal rate, regular rhythm. Grossly normal heart sounds.  2+ radial pulses bilaterally. Respiratory: Normal respiratory effort.  No retractions. Lungs CTAB. Gastrointestinal: Soft and nontender. No distention. Musculoskeletal: No lower extremity tenderness nor edema.  Approximately 2 cm laceration to the ventral surface of left pinky finger with no tendon involvement.  Range of  motion intact throughout small finger. Neurologic:  Normal speech and language. No gross focal neurologic deficits are appreciated.    ED Results / Procedures / Treatments   Labs (all labs ordered are listed, but only abnormal results are displayed) Labs Reviewed - No data to display  PROCEDURES:  Critical Care performed: No  ..Laceration Repair  Date/Time: 12/01/2021 3:55 AM  Performed by: 02/01/2022, MD Authorized by: Chesley Noon, MD   Consent:    Consent obtained:  Verbal   Consent given by:  Patient   Risks discussed:  Infection, pain, retained foreign body, tendon damage, vascular damage, poor wound healing, poor cosmetic result, need for additional repair and nerve damage Universal protocol:    Patient identity confirmed:  Verbally with patient and arm band Anesthesia:    Anesthesia method:  Local infiltration   Local anesthetic:  Lidocaine 1% w/o epi Laceration details:    Location:  Finger   Finger location:  L small finger   Length (cm):  2 Exploration:    Limited defect created (wound extended): no     Hemostasis achieved with:  Direct pressure   Wound exploration: wound explored through full range of motion and entire depth of wound visualized  Wound extent: no areolar tissue violation noted, no fascia violation noted, no foreign bodies/material noted, no muscle damage noted, no nerve damage noted, no tendon damage noted, no underlying fracture noted and no vascular damage noted     Contaminated: no   Treatment:    Amount of cleaning:  Standard   Irrigation solution:  Tap water   Irrigation method:  Tap   Debridement:  None   Undermining:  None   Scar revision: no   Skin repair:    Repair method:  Sutures   Suture size:  4-0   Suture material:  Nylon   Suture technique:  Simple interrupted   Number of sutures:  2 Approximation:    Approximation:  Loose Repair type:    Repair type:  Simple Post-procedure details:    Dressing:   Non-adherent dressing   Procedure completion:  Tolerated well, no immediate complications    MEDICATIONS ORDERED IN ED: Medications  lidocaine (PF) (XYLOCAINE) 1 % injection 5 mL (5 mLs Infiltration Given 12/01/21 0253)  Tdap (BOOSTRIX) injection 0.5 mL (0.5 mLs Intramuscular Given 12/01/21 0253)     IMPRESSION / MDM / ASSESSMENT AND PLAN / ED COURSE  I reviewed the triage vital signs and the nursing notes.                              41 y.o. female with no significant past medical history presents to the ED complaining of laceration to the ventral surface of her left pinky finger while attempting to cut watermelon.  Patient's presentation is most consistent with acute, uncomplicated illness.  Differential diagnosis includes, but is not limited to, finger laceration, tendon injury, vascular injury.  Patient well-appearing and in no acute distress, vital signs are unremarkable and she is neurovascularly intact distal to laceration.  Laceration was irrigated with tap water and patient's tetanus was updated.  Laceration repaired with 2 nylon sutures and patient is appropriate for discharge home, was counseled to have sutures removed in about 1 week.  She was counseled to return to the ED for new or worsening symptoms, patient agrees with plan.      FINAL CLINICAL IMPRESSION(S) / ED DIAGNOSES   Final diagnoses:  Laceration of left little finger without foreign body without damage to nail, initial encounter     Rx / DC Orders   ED Discharge Orders     None        Note:  This document was prepared using Dragon voice recognition software and may include unintentional dictation errors.   Chesley Noon, MD 12/01/21 0400

## 2022-04-22 DIAGNOSIS — Z419 Encounter for procedure for purposes other than remedying health state, unspecified: Secondary | ICD-10-CM | POA: Diagnosis not present

## 2022-05-23 DIAGNOSIS — Z419 Encounter for procedure for purposes other than remedying health state, unspecified: Secondary | ICD-10-CM | POA: Diagnosis not present

## 2022-06-23 DIAGNOSIS — Z419 Encounter for procedure for purposes other than remedying health state, unspecified: Secondary | ICD-10-CM | POA: Diagnosis not present

## 2022-06-29 ENCOUNTER — Encounter: Payer: Self-pay | Admitting: Emergency Medicine

## 2022-06-29 ENCOUNTER — Emergency Department: Payer: BC Managed Care – PPO

## 2022-06-29 ENCOUNTER — Other Ambulatory Visit: Payer: Self-pay

## 2022-06-29 ENCOUNTER — Emergency Department
Admission: EM | Admit: 2022-06-29 | Discharge: 2022-06-29 | Disposition: A | Discharge status: Home / Self Care | Payer: BC Managed Care – PPO | Attending: Emergency Medicine | Admitting: Emergency Medicine

## 2022-06-29 DIAGNOSIS — R079 Chest pain, unspecified: Secondary | ICD-10-CM

## 2022-06-29 DIAGNOSIS — O99891 Other specified diseases and conditions complicating pregnancy: Secondary | ICD-10-CM | POA: Diagnosis present

## 2022-06-29 DIAGNOSIS — O26892 Other specified pregnancy related conditions, second trimester: Secondary | ICD-10-CM | POA: Diagnosis not present

## 2022-06-29 DIAGNOSIS — R072 Precordial pain: Secondary | ICD-10-CM | POA: Diagnosis not present

## 2022-06-29 DIAGNOSIS — I10 Essential (primary) hypertension: Secondary | ICD-10-CM | POA: Insufficient documentation

## 2022-06-29 DIAGNOSIS — R102 Pelvic and perineal pain: Secondary | ICD-10-CM | POA: Diagnosis not present

## 2022-06-29 DIAGNOSIS — Z3A22 22 weeks gestation of pregnancy: Secondary | ICD-10-CM | POA: Diagnosis not present

## 2022-06-29 LAB — URINALYSIS, ROUTINE W REFLEX MICROSCOPIC
Bilirubin Urine: NEGATIVE
Glucose, UA: NEGATIVE mg/dL
Hgb urine dipstick: NEGATIVE
Ketones, ur: NEGATIVE mg/dL
Leukocytes,Ua: NEGATIVE
Nitrite: NEGATIVE
Protein, ur: NEGATIVE mg/dL
Specific Gravity, Urine: 1.008 (ref 1.005–1.030)
pH: 5 (ref 5.0–8.0)

## 2022-06-29 LAB — CBC
HCT: 39.7 % (ref 36.0–46.0)
Hemoglobin: 13.3 g/dL (ref 12.0–15.0)
MCH: 30.9 pg (ref 26.0–34.0)
MCHC: 33.5 g/dL (ref 30.0–36.0)
MCV: 92.3 fL (ref 80.0–100.0)
Platelets: 278 10*3/uL (ref 150–400)
RBC: 4.3 MIL/uL (ref 3.87–5.11)
RDW: 12.2 % (ref 11.5–15.5)
WBC: 6.9 10*3/uL (ref 4.0–10.5)
nRBC: 0 % (ref 0.0–0.2)

## 2022-06-29 LAB — COMPREHENSIVE METABOLIC PANEL
ALT: 14 U/L (ref 0–44)
AST: 23 U/L (ref 15–41)
Albumin: 3.3 g/dL — ABNORMAL LOW (ref 3.5–5.0)
Alkaline Phosphatase: 73 U/L (ref 38–126)
Anion gap: 7 (ref 5–15)
BUN: 6 mg/dL (ref 6–20)
CO2: 22 mmol/L (ref 22–32)
Calcium: 9.6 mg/dL (ref 8.9–10.3)
Chloride: 106 mmol/L (ref 98–111)
Creatinine, Ser: 0.61 mg/dL (ref 0.44–1.00)
GFR, Estimated: >60 mL/min (ref >60)
Glucose, Bld: 104 mg/dL — ABNORMAL HIGH (ref 70–99)
Potassium: 3.5 mmol/L (ref 3.5–5.1)
Sodium: 135 mmol/L (ref 135–145)
Total Bilirubin: 0.8 mg/dL (ref 0.3–1.2)
Total Protein: 6.6 g/dL (ref 6.5–8.1)

## 2022-06-29 LAB — TROPONIN I (HIGH SENSITIVITY)
Troponin I (High Sensitivity): 3 ng/L (ref <18)
Troponin I (High Sensitivity): <2 ng/L (ref <18)

## 2022-06-29 LAB — D-DIMER, QUANTITATIVE: D-Dimer, Quant: <0.27 ug/mL-FEU (ref 0.00–0.50)

## 2022-06-29 NOTE — ED Notes (Signed)
Pt declined final set of vitals in preference for checking bp at home. Pt continues to deny active chest pain.  Patient discharged at this time. Ambulated to lobby with independent and steady gait. Breathing unlabored speaking in full sentences. Verbalized understanding of all discharge, follow up, and medication teaching. Discharged homed with all belongings.

## 2022-06-29 NOTE — ED Triage Notes (Addendum)
C/O mid chest pain x 30 minutes PTA.  [redacted] weeks pregnant.  Also c/o lower abdominal cramping.  Initially only CP, abd pain just starting.  AAOx3. Skin warm and dry. NAD.  No SOB/ DOE.

## 2022-06-29 NOTE — ED Triage Notes (Addendum)
Pt states that while she was working from home she started having chest pain, states that she has a hx of pvc's, pt states after she got to the ER she started having lower abd cramping, pt states that she took her bp meds today     Fetal heart tones auscultated in triage with a doppler at 160 beats per minute

## 2022-06-29 NOTE — ED Provider Notes (Signed)
[facility] Provider Note  Patient Contact: 6:00 PM (approximate)   History   No chief complaint on file.   HPI  Joyce Weber is a 42 y.o. female with a history of hypertension, migraines and A-fib currently G7, P2 presenting to the emergency department with pleuritic midsternal anterior chest pain with worsening shortness of breath as well as lower pelvic tightness.  Patient states that her symptoms started shortly before a work meeting around 2:30 PM.  Patient states that she has had similar chest discomfort in the past associated with A-fib/PVCs.  She denies cough, rhinorrhea, nasal congestion or fever.  No vaginal bleeding.  No nausea, vomiting or diarrhea.      Physical Exam   Triage Vital Signs: ED Triage Vitals  Enc Vitals Group     BP 06/29/22 1642 (!) 143/100     Pulse Rate 06/29/22 1642 89     Resp 06/29/22 1642 16     Temp 06/29/22 1642 98.4 F (36.9 C)     Temp Source 06/29/22 1642 Oral     SpO2 06/29/22 1642 100 %     Weight 06/29/22 1650 206 lb (93.4 kg)     Height 06/29/22 1650 5\' 7"  (1.702 m)     Head Circumference --      Peak Flow --      Pain Score 06/29/22 1649 8     Pain Loc --      Pain Edu? --      Excl. in [place]? --     Most recent vital signs: Vitals:   06/29/22 1642 06/29/22 1957  BP: (!) 143/100 (!) 140/98  Pulse: 89 89  Resp: 16 18  Temp: 98.4 F (36.9 C)   SpO2: 100% 99%     General: Alert and in no acute distress. Eyes:  PERRL. EOMI. Head: No acute traumatic findings ENT:      Nose: No congestion/rhinnorhea.      Mouth/Throat: Mucous membranes are moist. Neck: No stridor. No cervical spine tenderness to palpation. Cardiovascular:  Good peripheral perfusion Respiratory: Normal respiratory effort without tachypnea or retractions. Lungs CTAB. Good air entry to the bases with no decreased or absent breath sounds. Gastrointestinal: Bowel sounds 4 quadrants. Soft and nontender to palpation. No guarding or  rigidity. No palpable masses. No distention. No CVA tenderness. Musculoskeletal: Full range of motion to all extremities.  Neurologic:  No gross focal neurologic deficits are appreciated.  Skin:   No rash noted    ED Results / Procedures / Treatments   Labs (all labs ordered are listed, but only abnormal results are displayed) Labs Reviewed  COMPREHENSIVE METABOLIC PANEL - Abnormal; Notable for the following components:      Result Value   Glucose, Bld 104 (*)    Albumin 3.3 (*)    All other components within normal limits  URINALYSIS, ROUTINE W REFLEX MICROSCOPIC - Abnormal; Notable for the following components:   Color, Urine STRAW (*)    APPearance CLEAR (*)    All other components within normal limits  CBC  D-DIMER, QUANTITATIVE  TROPONIN I (HIGH SENSITIVITY)  TROPONIN I (HIGH SENSITIVITY)     EKG  Normal sinus rhythm without ST segment elevation or other apparent arrhythmia.   RADIOLOGY  I personally viewed and evaluated these images as part of my medical decision making, as well as reviewing the written report by the radiologist.  ED Provider Interpretation: No.  Single viable intrauterine pregnancy at 167 bpm   PROCEDURES:  Critical Care performed: No  Procedures   MEDICATIONS ORDERED IN ED: Medications - No data to display   IMPRESSION / MDM / [place] / ED COURSE  I reviewed the triage vital signs and the nursing notes.                              Assessment and plan:  Nonspecific chest pain 42 year old female presents to the emergency department with pleuritic central chest pain.  Patient was mildly hypertensive at triage but vital signs were otherwise reassuring.  On exam, patient was alert and nontoxic-appearing.  Chest pain did not have a reproducible component.  EKG indicated normal sinus rhythm without atrial fibrillation or PVCs noted.  Delta troponin within range.  D-dimer within range.  CBC and CMP reassuring.  Urinalysis  shows no UTI  Upon recheck, patient was resting comfortably and stated that her chest pain had resolved.  I did recommend following up with OB/GYN regarding hypertension noted during emergency department course.  Return precautions were given to return with new or worsening symptoms.   FINAL CLINICAL IMPRESSION(S) / ED DIAGNOSES   Final diagnoses:  Nonspecific chest pain     Rx / DC Orders   ED Discharge Orders     None        Note:  This document was prepared using Dragon voice recognition software and may include unintentional dictation errors.   Vallarie Mare [place], Hershal Coria 06/29/22 2048    Harvest Dark, MD 06/29/22 2252

## 2022-07-22 DIAGNOSIS — Z419 Encounter for procedure for purposes other than remedying health state, unspecified: Secondary | ICD-10-CM | POA: Diagnosis not present

## 2022-08-22 DIAGNOSIS — Z419 Encounter for procedure for purposes other than remedying health state, unspecified: Secondary | ICD-10-CM | POA: Diagnosis not present

## 2022-11-30 ENCOUNTER — Other Ambulatory Visit: Payer: Self-pay

## 2022-11-30 ENCOUNTER — Emergency Department
Admission: EM | Admit: 2022-11-30 | Discharge: 2022-11-30 | Disposition: A | Discharge status: Home / Self Care | Payer: BC Managed Care – PPO | Attending: Emergency Medicine | Admitting: Emergency Medicine

## 2022-11-30 DIAGNOSIS — R42 Dizziness and giddiness: Secondary | ICD-10-CM | POA: Diagnosis present

## 2022-11-30 DIAGNOSIS — R55 Syncope and collapse: Secondary | ICD-10-CM | POA: Insufficient documentation

## 2022-11-30 LAB — COMPREHENSIVE METABOLIC PANEL
ALT: 29 U/L (ref 0–44)
AST: 26 U/L (ref 15–41)
Albumin: 3.8 g/dL (ref 3.5–5.0)
Alkaline Phosphatase: 74 U/L (ref 38–126)
Anion gap: 7 (ref 5–15)
BUN: 10 mg/dL (ref 6–20)
CO2: 21 mmol/L — ABNORMAL LOW (ref 22–32)
Calcium: 8.9 mg/dL (ref 8.9–10.3)
Chloride: 108 mmol/L (ref 98–111)
Creatinine, Ser: 0.71 mg/dL (ref 0.44–1.00)
GFR, Estimated: >60 mL/min (ref >60)
Glucose, Bld: 120 mg/dL — ABNORMAL HIGH (ref 70–99)
Potassium: 3.3 mmol/L — ABNORMAL LOW (ref 3.5–5.1)
Sodium: 136 mmol/L (ref 135–145)
Total Bilirubin: 1 mg/dL (ref 0.3–1.2)
Total Protein: 6.7 g/dL (ref 6.5–8.1)

## 2022-11-30 LAB — URINALYSIS, ROUTINE W REFLEX MICROSCOPIC
Bilirubin Urine: NEGATIVE
Glucose, UA: NEGATIVE mg/dL
Ketones, ur: NEGATIVE mg/dL
Leukocytes,Ua: NEGATIVE
Nitrite: NEGATIVE
Protein, ur: NEGATIVE mg/dL
Specific Gravity, Urine: 1.014 (ref 1.005–1.030)
pH: 6 (ref 5.0–8.0)

## 2022-11-30 LAB — CBC
HCT: 39.4 % (ref 36.0–46.0)
Hemoglobin: 13.2 g/dL (ref 12.0–15.0)
MCH: 29.6 pg (ref 26.0–34.0)
MCHC: 33.5 g/dL (ref 30.0–36.0)
MCV: 88.3 fL (ref 80.0–100.0)
Platelets: 265 10*3/uL (ref 150–400)
RBC: 4.46 MIL/uL (ref 3.87–5.11)
RDW: 12.1 % (ref 11.5–15.5)
WBC: 5.2 10*3/uL (ref 4.0–10.5)
nRBC: 0 % (ref 0.0–0.2)

## 2022-11-30 LAB — TROPONIN I (HIGH SENSITIVITY): Troponin I (High Sensitivity): 3 ng/L (ref <18)

## 2022-11-30 LAB — POC URINE PREG, ED: Preg Test, Ur: NEGATIVE

## 2022-11-30 MED ORDER — POTASSIUM CHLORIDE CRYS ER 20 MEQ PO TBCR
40.0000 meq | EXTENDED_RELEASE_TABLET | Freq: Once | ORAL | Status: AC
  Administered 2022-11-30: 40 meq via ORAL
  Filled 2022-11-30: qty 2

## 2022-11-30 MED ORDER — SODIUM CHLORIDE 0.9 % IV BOLUS
500.0000 mL | Freq: Once | INTRAVENOUS | Status: AC
  Administered 2022-11-30: 500 mL via INTRAVENOUS

## 2022-11-30 NOTE — ED Provider Notes (Addendum)
[facility] Provider Note    Event Date/Time   First MD Initiated Contact with Patient 11/30/22 [phone]     (approximate)   History   Dizziness   HPI Joyce Weber is a 42 y.o. female who presents for evaluation of episodic lightheadedness.  She said that it has been going on now for few days.  It is happening about once a day but at various times of the day and not on a regular predictable pattern.  She said she has had vertigo in the past and this does not feel the same way; rather than feeling a sensation of things spinning, she just feels very lightheaded and like she might pass out.  The episode tonight was the most severe and she felt like she needed to grab onto something to make sure she was not going to pass out.  She went about 2 days ago to the same-day clinic at her primary care office at [facility] for evaluation.  They told her that she most likely was a little bit dehydrated and to drink more fluids.  They recommended that she follow-up with them and may consider a Zio patch if she continues to have the episodes but recommended that if her blood pressure went up very high where she had more severe symptoms she should come to the emergency department for evaluation.  She has not had any pain.  She denies chest pain, palpitations, shortness of breath, nausea, vomiting, abdominal pain, dysuria.  She has not had any weakness or numbness in any of her extremities or other neurological deficits.  She is quite concerned about possibly passing out due to the fact that she is breast-feeding a 83-month-old and is concerned it may lead to harming her child.  She says she currently has no symptoms but she is worried about when they come back.     Physical Exam   Triage Vital Signs: ED Triage Vitals  Enc Vitals Group     BP 11/30/22 0422 (!) 138/92     Pulse Rate 11/30/22 0422 78     Resp 11/30/22 0422 19     Temp 11/30/22 0422 97.7 F (36.5 C)     Temp Source  11/30/22 0422 Oral     SpO2 11/30/22 0422 99 %     Weight 11/30/22 0421 98.9 kg (218 lb)     Height 11/30/22 0421 1.702 m (5\' 7" )     Head Circumference --      Peak Flow --      Pain Score 11/30/22 0421 0     Pain Loc --      Pain Edu? --      Excl. in [place]? --     Most recent vital signs: Vitals:   11/30/22 0530 11/30/22 0630  BP: 117/83 (!) 132/90  Pulse: 84 73  Resp: 18 20  Temp:    SpO2: 97% 98%    General: Awake, no distress.  Generally well-appearing. HEENT: No nystagmus.  Normal extraocular movement.  Bilateral ear canals and tympanic membranes are normal in appearance without evidence of erythema nor effusion. CV:  Good peripheral perfusion.  Normal heart sounds, regular rate and rhythm. Resp:  Normal effort. Speaking easily and comfortably, no accessory muscle usage nor intercostal retractions.  Lungs are clear to auscultation. Abd:  No distention.  Other:  Patient has normal grip strength bilaterally and good and equal bilateral upper arm and leg strength.  No aphasia nor dysarthria.  No obvious  facial droop or other obvious focal neurological deficit.  Mood and affect are normal and appropriate under the circumstances.  Alert and oriented x 3.   ED Results / Procedures / Treatments   Labs (all labs ordered are listed, but only abnormal results are displayed) Labs Reviewed  COMPREHENSIVE METABOLIC PANEL - Abnormal; Notable for the following components:      Result Value   Potassium 3.3 (*)    CO2 21 (*)    Glucose, Bld 120 (*)    All other components within normal limits  URINALYSIS, ROUTINE W REFLEX MICROSCOPIC - Abnormal; Notable for the following components:   Color, Urine YELLOW (*)    APPearance HAZY (*)    Hgb urine dipstick SMALL (*)    Bacteria, UA RARE (*)    All other components within normal limits  CBC  POC URINE PREG, ED  TROPONIN I (HIGH SENSITIVITY)     EKG  ED ECG REPORT I, Loleta Rose, the attending physician, personally viewed and  interpreted this ECG.  Date: 11/30/2022 EKG Time: 4:27 AM Rate: 75 Rhythm: normal sinus rhythm QRS Axis: normal Intervals: normal ST/T Wave abnormalities: normal Narrative Interpretation: no evidence of acute ischemia   ED ECG REPORT I, Loleta Rose, the attending physician, personally viewed and interpreted this ECG.  Date: 11/30/2022 EKG Time: 4:29 AM Rate: 75 Rhythm: normal sinus rhythm QRS Axis: normal Intervals: normal ST/T Wave abnormalities: normal Narrative Interpretation: no evidence of acute ischemia     RADIOLOGY No indication for emergent imaging   PROCEDURES:  Critical Care performed: No  .1-3 Lead EKG Interpretation  Performed by: Loleta Rose, MD Authorized by: Loleta Rose, MD     Interpretation: normal     ECG rate:  82   ECG rate assessment: normal     Rhythm: sinus rhythm     Ectopy: none     Conduction: normal       IMPRESSION / MDM / ASSESSMENT AND PLAN / ED COURSE  I reviewed the triage vital signs and the nursing notes.                              Differential diagnosis includes, but is not limited to, volume depletion, cardiac arrhythmia, electrolyte disturbance, anemia, less likely CVA.  Patient's presentation is most consistent with acute presentation with potential threat to life or bodily function.  Labs/studies ordered: CBC, CMP, HS troponin, UA, U-preg  Interventions/Medications given:  Medications  sodium chloride 0.9 % bolus 500 mL (0 mLs Intravenous Stopped 11/30/22 0645)  potassium chloride SA (KLOR-CON M) CR tablet 40 mEq (40 mEq Oral Given 11/30/22 0600)    (Note:  hospital course my include additional interventions and/or labs/studies not listed above.)   Vital signs are stable and within normal limits.  Physical exam is also quite reassuring with no evidence of any focal neurological deficits nor any cardiopulmonary abnormalities.  I reviewed her medical record including her [facility] clinic note from 11/28/2022 and  I reviewed her labs and compared them to the labs today.  She had a few small abnormal values on metabolic panel from several days ago (most notably a slightly elevated chloride, slightly decreased anion gap, slightly decreased BUN) which have resolved.  I verified the primary care provider's believe that this was mostly volume related but they also discussed the possible ordering of a Zio patch as outpatient.  I considered advanced imaging but the patient's symptoms/episodes are not  consistent with a CVA or TIA.  2 EKGs are reassuring as well as the EKG from her primary care office.  There is no evidence that she is suffering from cardiac arrhythmia.  She does not appear acutely dehydrated and has no acute kidney injury.  No signs or symptoms to suggest a thromboembolism.  No evidence of acute infection  The patient was on the cardiac monitor to evaluate for evidence of arrhythmia and/or significant heart rate changes.  Hemoglobin is normal and she has no leukocytosis.  She has a very slight decreased potassium at 3.3 and I ordered 40 mill equivalents by mouth.  In order to help replete any possible volume loss I provided normal saline 500 mL IV bolus.  I talked with the patient extensively about her symptoms and the reassuring workup.  She is frustrated at the lack of a specific diagnosis and is understandably concerned about this happening again, but I explained that I do not have a specific diagnosis for which I could admit her to the hospital and that her evaluation is reassuring.  I strongly encouraged her to follow-up with her primary care doctor to discuss whether she should get a Zio patch or other form of cardiac monitor as an outpatient.  She says that she will follow-up with her regular doctor and I gave my usual return precautions.   Clinical Course as of 11/30/22 4098  Wed Nov 30, 2022  1191 The patient's nurse reported to me that she was ambulatory to and from the bathroom without any  difficulties or without any recurrent symptoms.  She requested that I come back to the room to talk to her again prior to discharge.  Her nurse, Freida Busman Maurine Minister), was present in the room with me as an observer.  She expressed that she had spoken with her boyfriend and that she had some concerns over the possibility that she may have complications from her cesarean section.  She asked about the possibility of an intra-abdominal hematoma or internal bleeding.  I explained that she has no abdominal pain, no anemia, no drop in her hemoglobin level from her prior labs, no leukocytosis, no fever, no tachycardia, and no other abnormalities that would suggest either internal bleeding or internal infection.  She asked how we would diagnose that she thing and I explained that if there was sufficient clinical suspicion, we would likely proceed with a CT scan of the abdomen and pelvis, but there is no clinical suspicion at this time.  She continued to express concern over her safety, but I again explained that her exam is reassuring, she is asymptomatic, and while I cannot give her a specific diagnosis, there is no reason to believe that she has an acute or emergent condition, and that she does not meet any criteria for hospitalization, nor does she require additional evaluation or treatment at this time.  I encouraged close outpatient follow-up with her primary care provider. [CF]    Clinical Course User Index [CF] Loleta Rose, MD     FINAL CLINICAL IMPRESSION(S) / ED DIAGNOSES   Final diagnoses:  Lightheadedness  Near syncope     Rx / DC Orders   ED Discharge Orders     None        Note:  This document was prepared using Dragon voice recognition software and may include unintentional dictation errors.   Loleta Rose, MD 11/30/22 4782    Loleta Rose, MD 11/30/22 [phone]

## 2022-11-30 NOTE — ED Notes (Signed)
Pt requesting to speak with Dr. York Cerise before being discharged. Dr. At bedside.

## 2022-11-30 NOTE — Discharge Instructions (Signed)
Your workup in the Emergency Department today was reassuring.  We did not find any specific abnormalities.  We recommend you drink plenty of fluids, take your regular medications and/or any new ones prescribed today, and follow up with the doctor(s) listed in these documents as recommended.  Return to the Emergency Department if you develop new or worsening symptoms that concern you.  

## 2022-11-30 NOTE — ED Triage Notes (Signed)
Pt presents to ER with c/o intermittent dizziness and light headed feeling that started 7/7 and has been happening in spells since then.  Pt denies any blurry vision, but states she has had a HA, and has been nauseated with some episodes of vomiting.  Pt states dizziness is same regardless of position changes.  Pt is otherwise A&O x4 and in NAD on arrival.

## 2023-01-12 IMAGING — DX DG CHEST 1V PORT
1 series · 1 of 1 positions shown · non-contrast
Comparison: Chest x-ray 05/31/2013.

CLINICAL DATA: 40-year-old female with history of chest pain.

EXAM:
PORTABLE CHEST 1 VIEW

[chest ap]
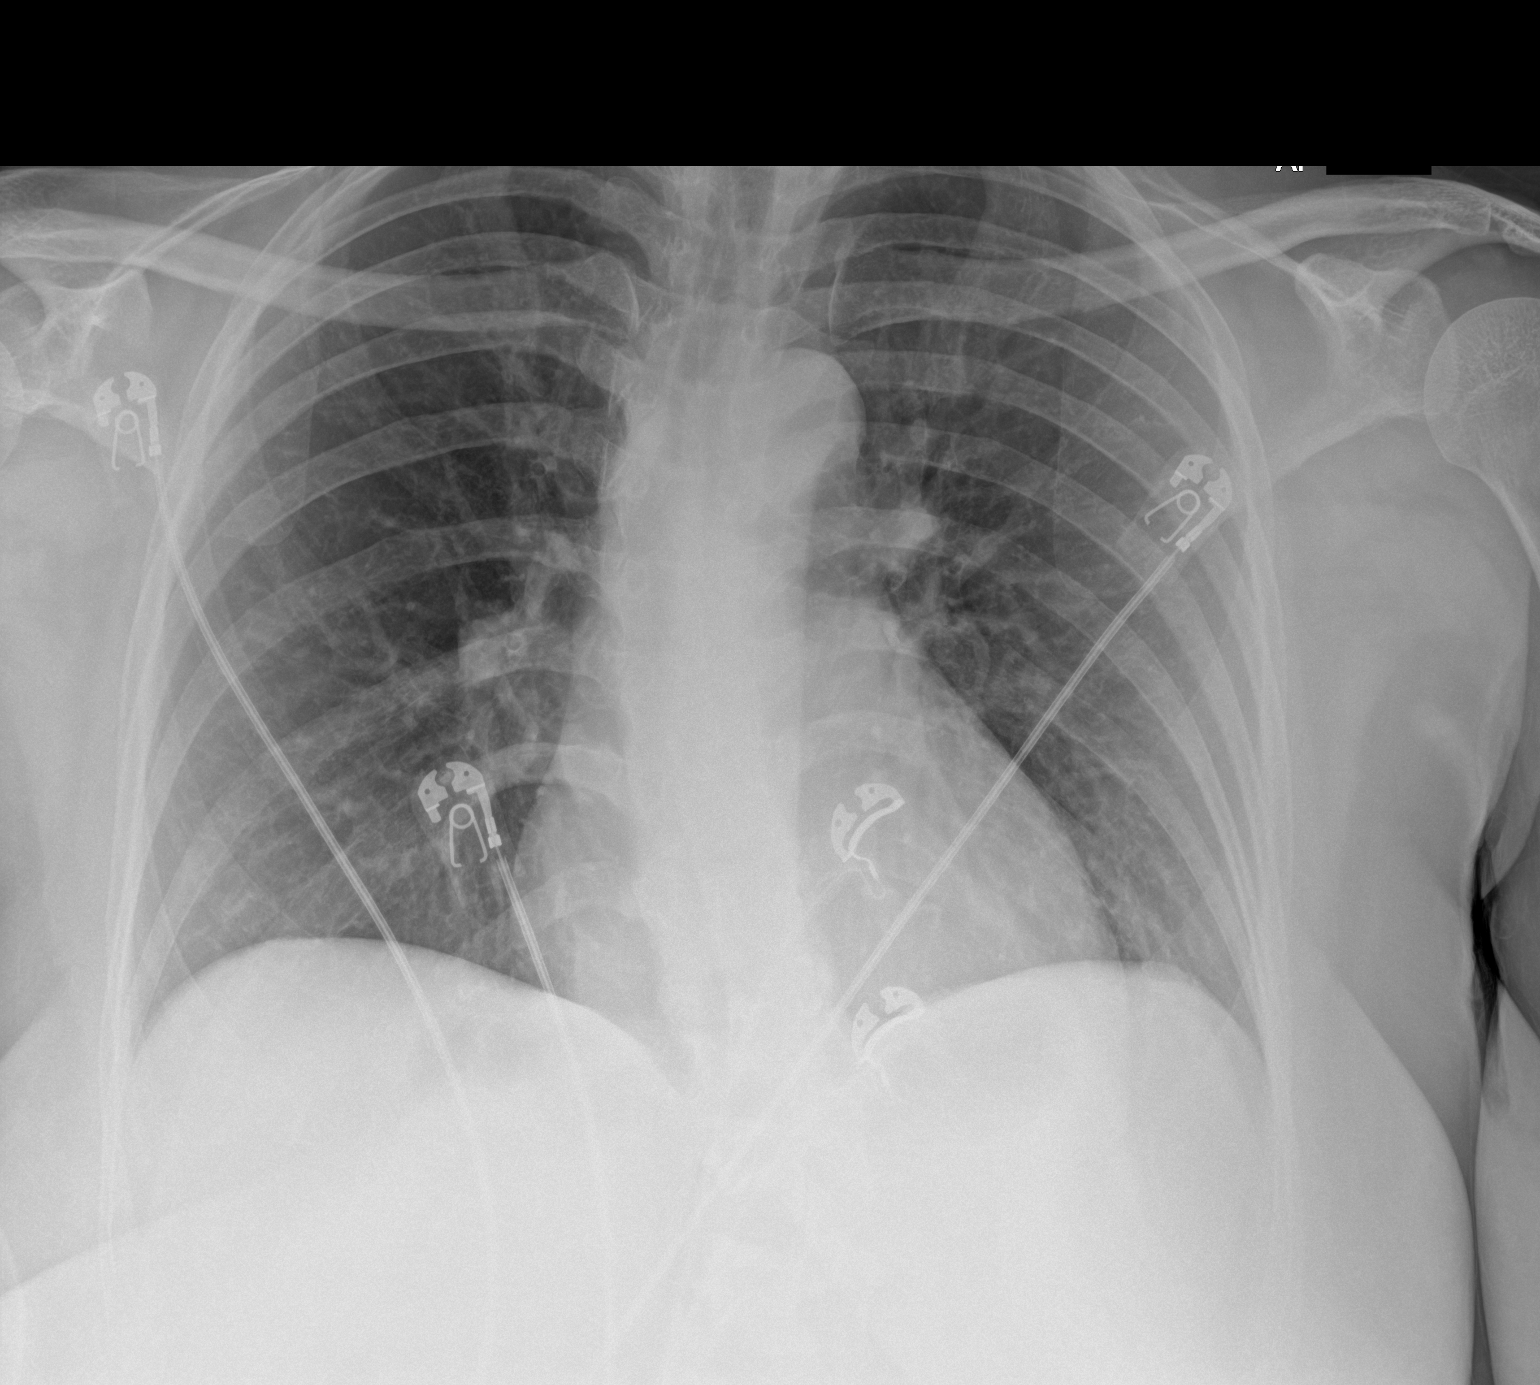

[1 of 1 positions shown; findings below may reference images not displayed]

FINDINGS: Lung volumes are normal. No consolidative airspace disease. No
pleural effusions. No pneumothorax. No pulmonary nodule or mass
noted. Pulmonary vasculature and the cardiomediastinal silhouette
are within normal limits.
IMPRESSION: No radiographic evidence of acute cardiopulmonary disease.

## 2024-02-19 ENCOUNTER — Ambulatory Visit (INDEPENDENT_AMBULATORY_CARE_PROVIDER_SITE_OTHER): Admitting: Podiatry

## 2024-02-19 ENCOUNTER — Encounter: Payer: Self-pay | Admitting: Podiatry

## 2024-02-19 DIAGNOSIS — D2372 Other benign neoplasm of skin of left lower limb, including hip: Secondary | ICD-10-CM | POA: Diagnosis not present

## 2024-02-19 DIAGNOSIS — M79672 Pain in left foot: Secondary | ICD-10-CM | POA: Diagnosis not present

## 2024-02-19 DIAGNOSIS — L851 Acquired keratosis [keratoderma] palmaris et plantaris: Secondary | ICD-10-CM
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
## 2024-02-22 ENCOUNTER — Encounter: Payer: Self-pay | Admitting: Podiatry

## 2024-03-18 ENCOUNTER — Encounter: Admitting: Podiatry

## 2024-03-18 NOTE — Progress Notes (Signed)
 Patient did not show for her scheduled follow-up appointment this afternoon
